# Patient Record
Sex: Male | Born: 1965 | Race: White | Hispanic: No | Smoking: Former smoker
Health system: Southern US, Community
[De-identification: ages and names within clinical notes are randomized; demographics above are authoritative.]

## PROBLEM LIST (undated history)

## (undated) DIAGNOSIS — G4730 Sleep apnea, unspecified: Secondary | ICD-10-CM

## (undated) DIAGNOSIS — K219 Gastro-esophageal reflux disease without esophagitis: Secondary | ICD-10-CM

## (undated) DIAGNOSIS — M1990 Unspecified osteoarthritis, unspecified site: Secondary | ICD-10-CM

## (undated) DIAGNOSIS — M109 Gout, unspecified: Secondary | ICD-10-CM

## (undated) DIAGNOSIS — Z87442 Personal history of urinary calculi: Secondary | ICD-10-CM

## (undated) HISTORY — PX: EYE SURGERY: SHX253

## (undated) HISTORY — PX: KIDNEY STONE SURGERY: SHX686

---

## 1999-11-03 ENCOUNTER — Encounter: Payer: Self-pay | Admitting: Urology

## 1999-11-03 ENCOUNTER — Encounter: Admission: RE | Admit: 1999-11-03 | Discharge: 1999-11-03 | Payer: Self-pay | Admitting: Urology

## 1999-11-11 ENCOUNTER — Encounter: Payer: Self-pay | Admitting: Urology

## 1999-11-11 ENCOUNTER — Ambulatory Visit (HOSPITAL_BASED_OUTPATIENT_CLINIC_OR_DEPARTMENT_OTHER): Admission: RE | Admit: 1999-11-11 | Discharge: 1999-11-11 | Payer: Self-pay | Admitting: Hematology and Oncology

## 2004-01-04 ENCOUNTER — Emergency Department (HOSPITAL_COMMUNITY): Admission: EM | Admit: 2004-01-04 | Discharge: 2004-01-04 | Payer: Self-pay | Admitting: Emergency Medicine

## 2004-10-26 ENCOUNTER — Emergency Department (HOSPITAL_COMMUNITY): Admission: EM | Admit: 2004-10-26 | Discharge: 2004-10-26 | Payer: Self-pay | Admitting: Emergency Medicine

## 2007-03-18 ENCOUNTER — Emergency Department (HOSPITAL_COMMUNITY): Admission: EM | Admit: 2007-03-18 | Discharge: 2007-03-18 | Payer: Self-pay | Admitting: Emergency Medicine

## 2011-10-19 ENCOUNTER — Ambulatory Visit
Admission: RE | Admit: 2011-10-19 | Discharge: 2011-10-19 | Disposition: A | Discharge status: Home / Self Care | Payer: BC Managed Care – PPO | Source: Ambulatory Visit | Attending: Family Medicine | Admitting: Family Medicine

## 2011-10-19 ENCOUNTER — Other Ambulatory Visit: Payer: Self-pay | Admitting: Family Medicine

## 2011-10-19 DIAGNOSIS — M25562 Pain in left knee: Secondary | ICD-10-CM

## 2012-09-08 ENCOUNTER — Ambulatory Visit: Payer: 59

## 2012-09-08 ENCOUNTER — Other Ambulatory Visit: Payer: Self-pay | Admitting: Physician Assistant

## 2012-09-08 ENCOUNTER — Ambulatory Visit (INDEPENDENT_AMBULATORY_CARE_PROVIDER_SITE_OTHER): Payer: 59 | Admitting: Emergency Medicine

## 2012-09-08 VITALS — BP 138/82 | HR 88 | Temp 98.1°F | Resp 16 | Ht 71.0 in | Wt 294.0 lb

## 2012-09-08 DIAGNOSIS — Z8639 Personal history of other endocrine, nutritional and metabolic disease: Secondary | ICD-10-CM

## 2012-09-08 DIAGNOSIS — M25539 Pain in unspecified wrist: Secondary | ICD-10-CM

## 2012-09-08 DIAGNOSIS — Z8739 Personal history of other diseases of the musculoskeletal system and connective tissue: Secondary | ICD-10-CM

## 2012-09-08 DIAGNOSIS — M109 Gout, unspecified: Secondary | ICD-10-CM

## 2012-09-08 LAB — POCT CBC
Granulocyte percent: 77.6 %G (ref 37–80)
MCH, POC: 28.8 pg (ref 27–31.2)
MCHC: 31.7 g/dL — AB (ref 31.8–35.4)
MCV: 91 fL (ref 80–97)
MID (cbc): 0.6 (ref 0–0.9)
POC LYMPH PERCENT: 16.8 %L (ref 10–50)
Platelet Count, POC: 273 10*3/uL (ref 142–424)
RDW, POC: 14.5 %

## 2012-09-08 LAB — BASIC METABOLIC PANEL
BUN: 19 mg/dL (ref 6–23)
Calcium: 9.6 mg/dL (ref 8.4–10.5)
Creat: 1.1 mg/dL (ref 0.50–1.35)
Glucose, Bld: 90 mg/dL (ref 70–99)
Sodium: 140 mEq/L (ref 135–145)

## 2012-09-08 MED ORDER — INDOMETHACIN 50 MG PO CAPS: 50.0000 mg | ORAL_CAPSULE | Freq: Three times a day (TID) | ORAL | Status: DC

## 2012-09-08 NOTE — Progress Notes (Signed)
Subjective:    Patient ID: Jordan Daniels, male    DOB: November 28, 1966, 46 y.o.   MRN: [id number]  HPI  Jordan Daniels is a 46 year old male with hx of gout who presents with 3 day history of right wrist pain.  He states that about 6 weeks ago his right wrist started hurting, and he felt it was a gout flare.  He took 2-3 days worth of meloxicam that he had left over from his last flare.  The pain resolved but returned 3 days ago.  He feels that this is also a gout flare, but he has never had them this close together before.  The pain is confined to his right wrist, which is where he always experiences gout pain.  He denies any mechanism of injury, but states that he does work with his hands and he also bowls.  He denies any numbness or tingling in the fingers.  His ROM is limited by pain.  He denies fever or chills.  He is not on uric acid lowering therapy but would be interested in trying this.    Review of Systems  Constitutional: Negative for fever and chills.  Respiratory: Negative.   Cardiovascular: Negative.   Gastrointestinal:       Some nausea today due to pain  Musculoskeletal: Negative.        Objective:   Physical Exam  Constitutional: He is oriented to person, place, and time. He appears well-developed and well-nourished. No distress.  Cardiovascular: Intact distal pulses.   Musculoskeletal:       Right elbow: Normal.      Right wrist: He exhibits decreased range of motion, tenderness and swelling.       Left wrist: Normal.  Neurological: He is alert and oriented to person, place, and time.  Skin: Skin is warm and dry.     Results for orders placed in visit on 09/08/12  POCT CBC      Component Value Range   WBC 11.4 (*) 4.6 - 10.2 K/uL   Lymph, poc 1.9  0.6 - 3.4   POC LYMPH PERCENT 16.8  10 - 50 %L   MID (cbc) 0.6  0 - 0.9   POC MID % 5.6  0 - 12 %M   POC Granulocyte 8.8 (*) 2 - 6.9   Granulocyte percent 77.6  37 - 80 %G   RBC 5.62  4.69 - 6.13 M/uL   Hemoglobin 16.2   14.1 - 18.1 g/dL   HCT, POC 13.0  86.5 - 53.7 %   MCV 91.0  80 - 97 fL   MCH, POC 28.8  27 - 31.2 pg   MCHC 31.7 (*) 31.8 - 35.4 g/dL   RDW, POC 78.4     Platelet Count, POC 273  142 - 424 K/uL   MPV 9.6  0 - 99.8 fL    [facility] reading (PRIMARY) by  Dr. Cleta Alberts, read as normal.      Assessment & Plan:   1. Gout  Basic metabolic panel, indomethacin (INDOCIN) 50 MG capsule  2. Wrist pain  DG Wrist 2 Views Right, POCT CBC  3. History of gout  Uric Acid   Jordan Daniels is a 46 yr old male with history of gout who presents with 3 day history of right wrist pain.  Wrist films were read as normal.  CBC shows only a slightly elevated WBC at 11.4, which is likely reactive.  Started him on indomethacin 50mg  TID.  He  may use Tylenol for breakthrough pain if needed.  BMP and uric acid levels are pending.  May consider urate lowering therapy depending on these results.  Discussed lifestyle modifications to help decrease the frequency of flares.  He will RTC if worsening or not improving.

## 2012-09-08 NOTE — Patient Instructions (Signed)
Gout Gout is an inflammatory condition (arthritis) caused by a buildup of uric acid crystals in the joints. Uric acid is a chemical that is normally present in the blood. Under some circumstances, uric acid can form into crystals in your joints. This causes joint redness, soreness, and swelling (inflammation). Repeat attacks are common. Over time, uric acid crystals can form into masses (tophi) near a joint, causing disfigurement. Gout is treatable and often preventable. CAUSES  The disease begins with elevated levels of uric acid in the blood. Uric acid is produced by your body when it breaks down a naturally found substance called purines. This also happens when you eat certain foods such as meats and fish. Causes of an elevated uric acid level include:  Being passed down from parent to child (heredity).  Diseases that cause increased uric acid production (obesity, psoriasis, some cancers).  Excessive alcohol use.  Diet, especially diets rich in meat and seafood.  Medicines, including certain cancer-fighting drugs (chemotherapy), diuretics, and aspirin.  Chronic kidney disease. The kidneys are no longer able to remove uric acid well.  Problems with metabolism. Conditions strongly associated with gout include:  Obesity.  High blood pressure.  High cholesterol.  Diabetes. Not everyone with elevated uric acid levels gets gout. It is not understood why some people get gout and others do not. Surgery, joint injury, and eating too much of certain foods are some of the factors that can lead to gout. SYMPTOMS   An attack of gout comes on quickly. It causes intense pain with redness, swelling, and warmth in a joint.  Fever can occur.  Often, only one joint is involved. Certain joints are more commonly involved:  Base of the big toe.  Knee.  Ankle.  Wrist.  Finger. Without treatment, an attack usually goes away in a few days to weeks. Between attacks, you usually will not have  symptoms, which is different from many other forms of arthritis. DIAGNOSIS  Your caregiver will suspect gout based on your symptoms and exam. Removal of fluid from the joint (arthrocentesis) is done to check for uric acid crystals. Your caregiver will give you a medicine that numbs the area (local anesthetic) and use a needle to remove joint fluid for exam. Gout is confirmed when uric acid crystals are seen in joint fluid, using a special microscope. Sometimes, blood, urine, and X-ray tests are also used. TREATMENT  There are 2 phases to gout treatment: treating the sudden onset (acute) attack and preventing attacks (prophylaxis). Treatment of an Acute Attack  Medicines are used. These include anti-inflammatory medicines or steroid medicines.  An injection of steroid medicine into the affected joint is sometimes necessary.  The painful joint is rested. Movement can worsen the arthritis.  You may use warm or cold treatments on painful joints, depending which works best for you.  Discuss the use of coffee, vitamin C, or cherries with your caregiver. These may be helpful treatment options. Treatment to Prevent Attacks After the acute attack subsides, your caregiver may advise prophylactic medicine. These medicines either help your kidneys eliminate uric acid from your body or decrease your uric acid production. You may need to stay on these medicines for a very long time. The early phase of treatment with prophylactic medicine can be associated with an increase in acute gout attacks. For this reason, during the first few months of treatment, your caregiver may also advise you to take medicines usually used for acute gout treatment. Be sure you understand your caregiver's directions.   You should also discuss dietary treatment with your caregiver. Certain foods such as meats and fish can increase uric acid levels. Other foods such as dairy can decrease levels. Your caregiver can give you a list of foods  to avoid. HOME CARE INSTRUCTIONS   Do not take aspirin to relieve pain. This raises uric acid levels.  Only take over-the-counter or prescription medicines for pain, discomfort, or fever as directed by your caregiver.  Rest the joint as much as possible. When in bed, keep sheets and blankets off painful areas.  Keep the affected joint raised (elevated).  Use crutches if the painful joint is in your leg.  Drink enough water and fluids to keep your urine clear or pale yellow. This helps your body get rid of uric acid. Do not drink alcoholic beverages. They slow the passage of uric acid.  Follow your caregiver's dietary instructions. Pay careful attention to the amount of protein you eat. Your daily diet should emphasize fruits, vegetables, whole grains, and fat-free or low-fat milk products.  Maintain a healthy body weight. SEEK MEDICAL CARE IF:   You have an oral temperature above 102 F (38.9 C).  You develop diarrhea, vomiting, or any side effects from medicines.  You do not feel better in 24 hours, or you are getting worse. SEEK IMMEDIATE MEDICAL CARE IF:   Your joint becomes suddenly more tender and you have:  Chills.  An oral temperature above 102 F (38.9 C), not controlled by medicine. MAKE SURE YOU:   Understand these instructions.  Will watch your condition.  Will get help right away if you are not doing well or get worse. Document Released: 11/13/2000 Document Revised: 02/08/2012 Document Reviewed: 02/24/2010 [employer] Patient Information 2013 [employer].    

## 2013-09-29 IMAGING — CR DG WRIST COMPLETE 3+V*R*
3 series · 3 of 3 positions shown · non-contrast
Comparison: None.

CLINICAL DATA: Right wrist pain.  History of gout.

RIGHT WRIST - COMPLETE 3+ VIEW

[PA]
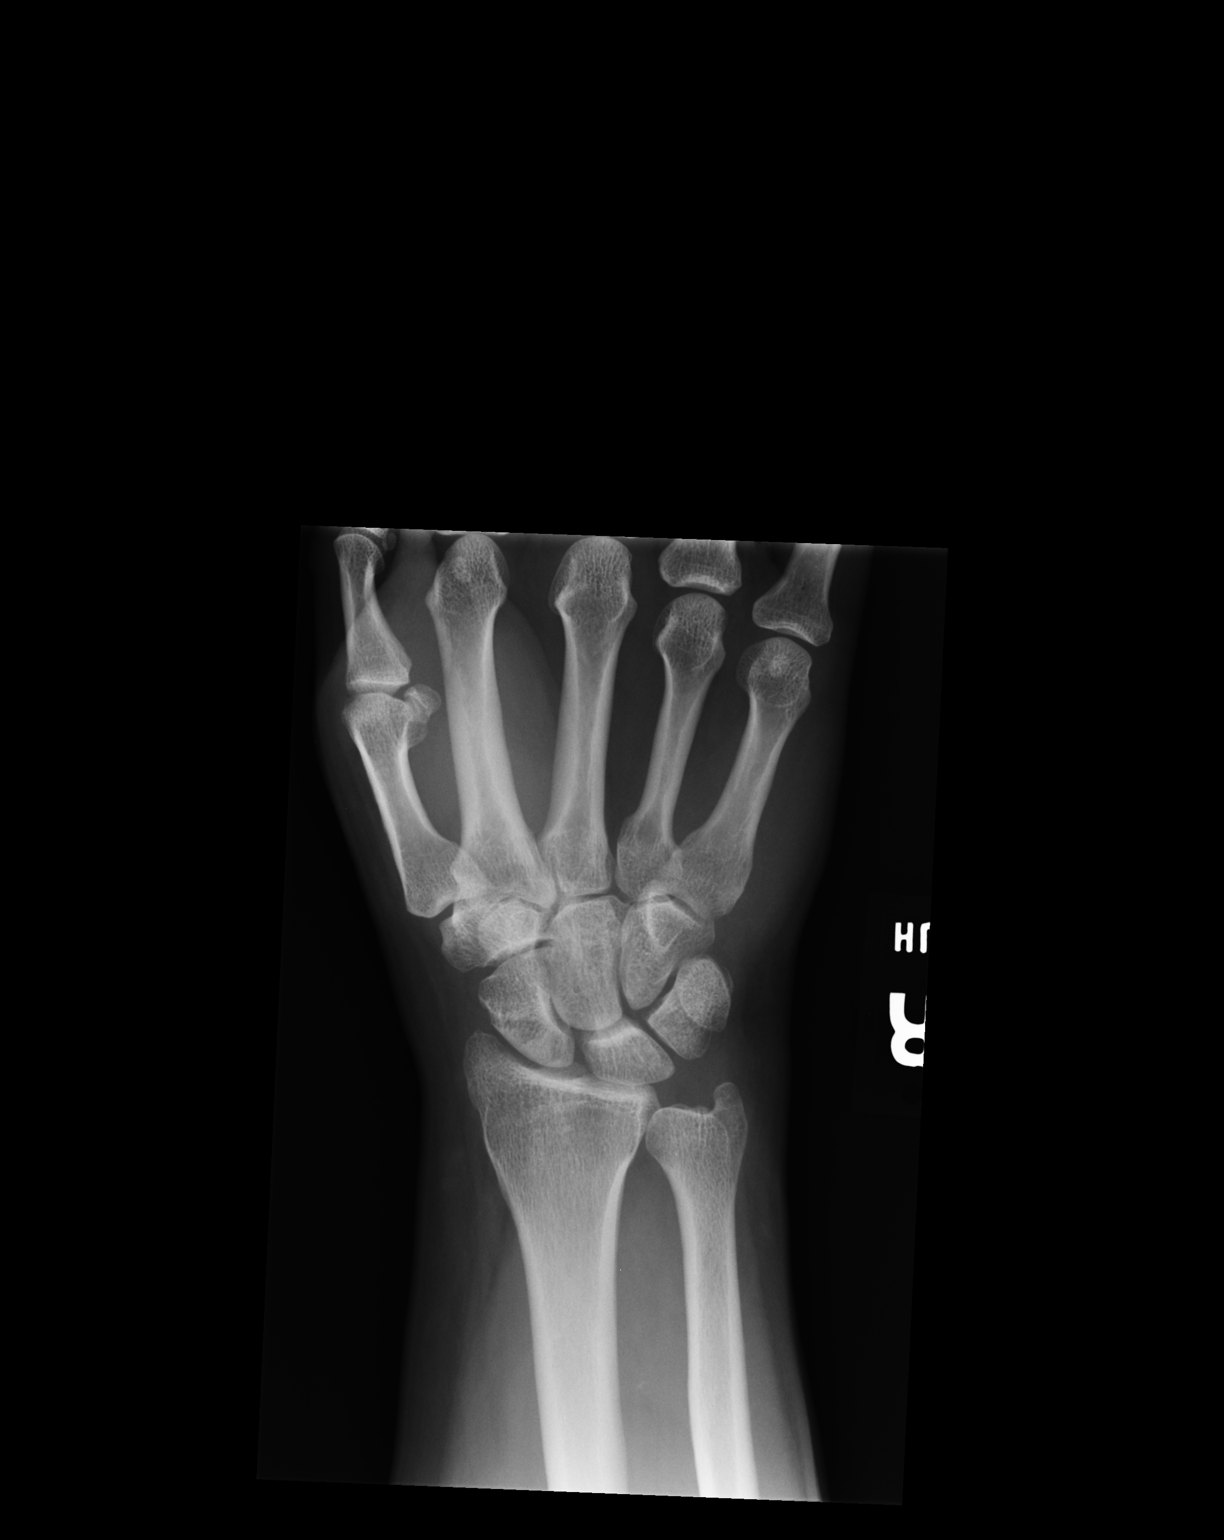

[lateral]
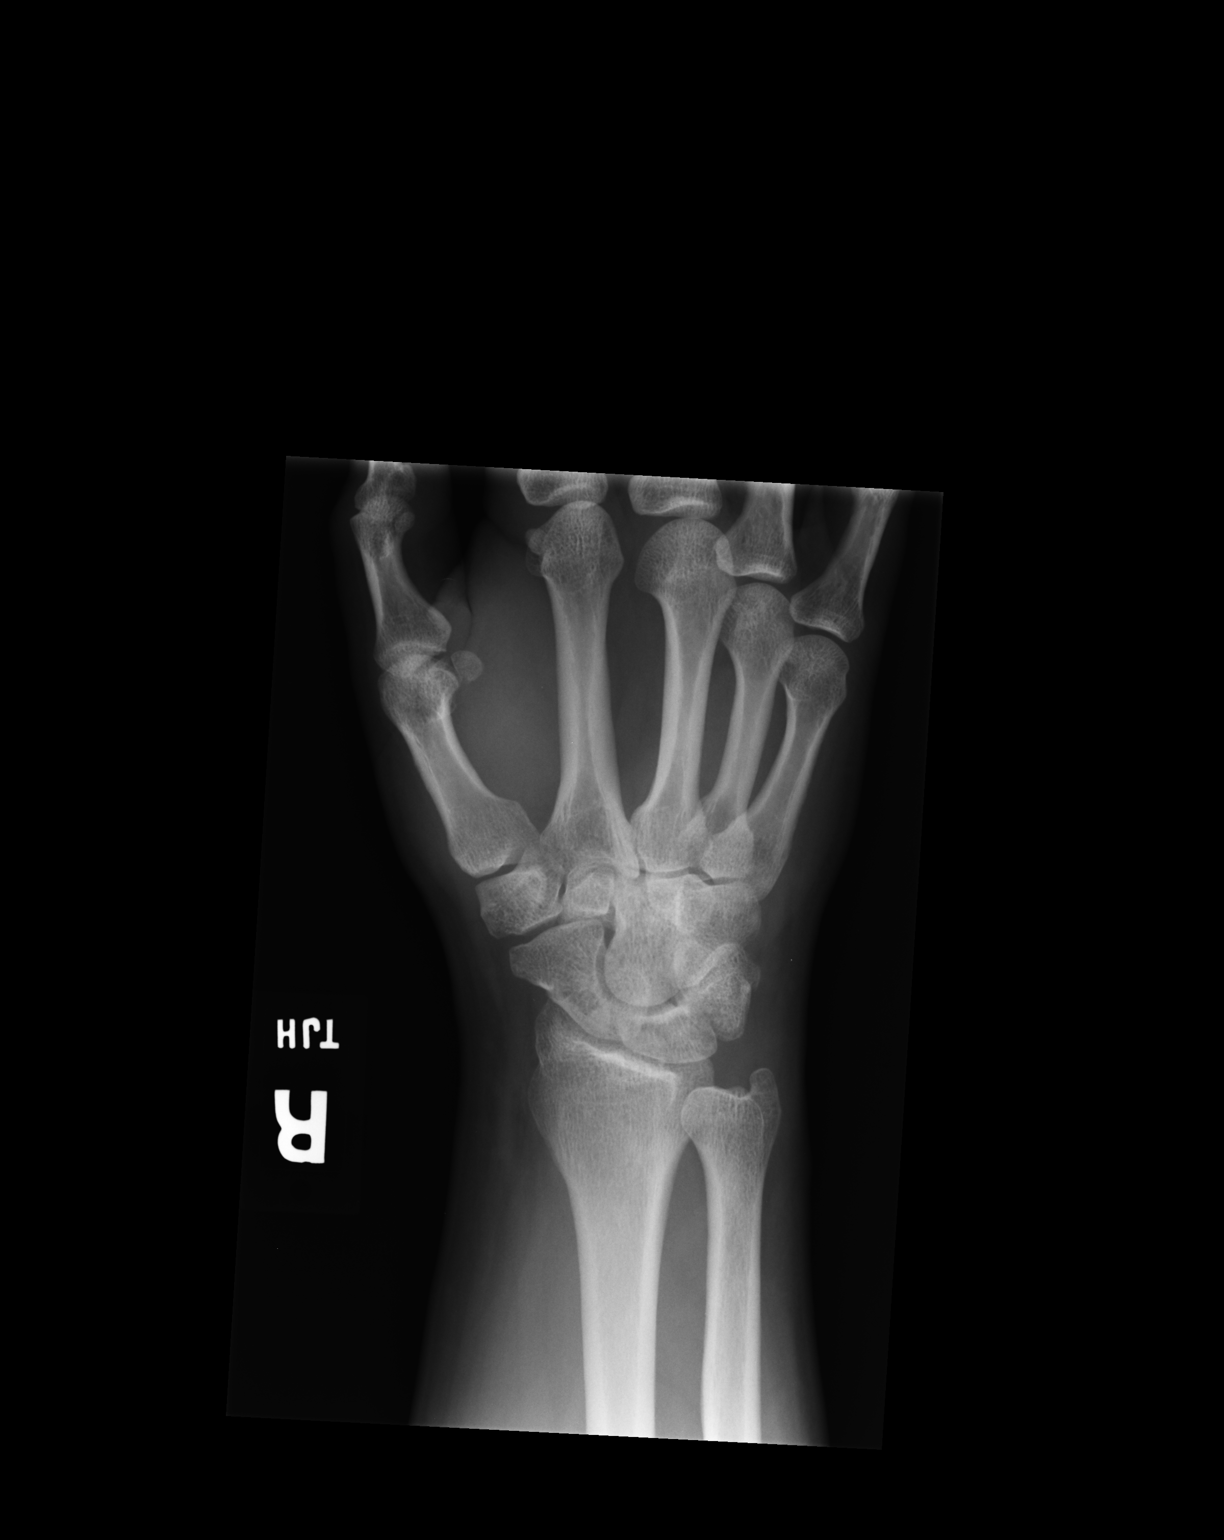

[ap ext rot]
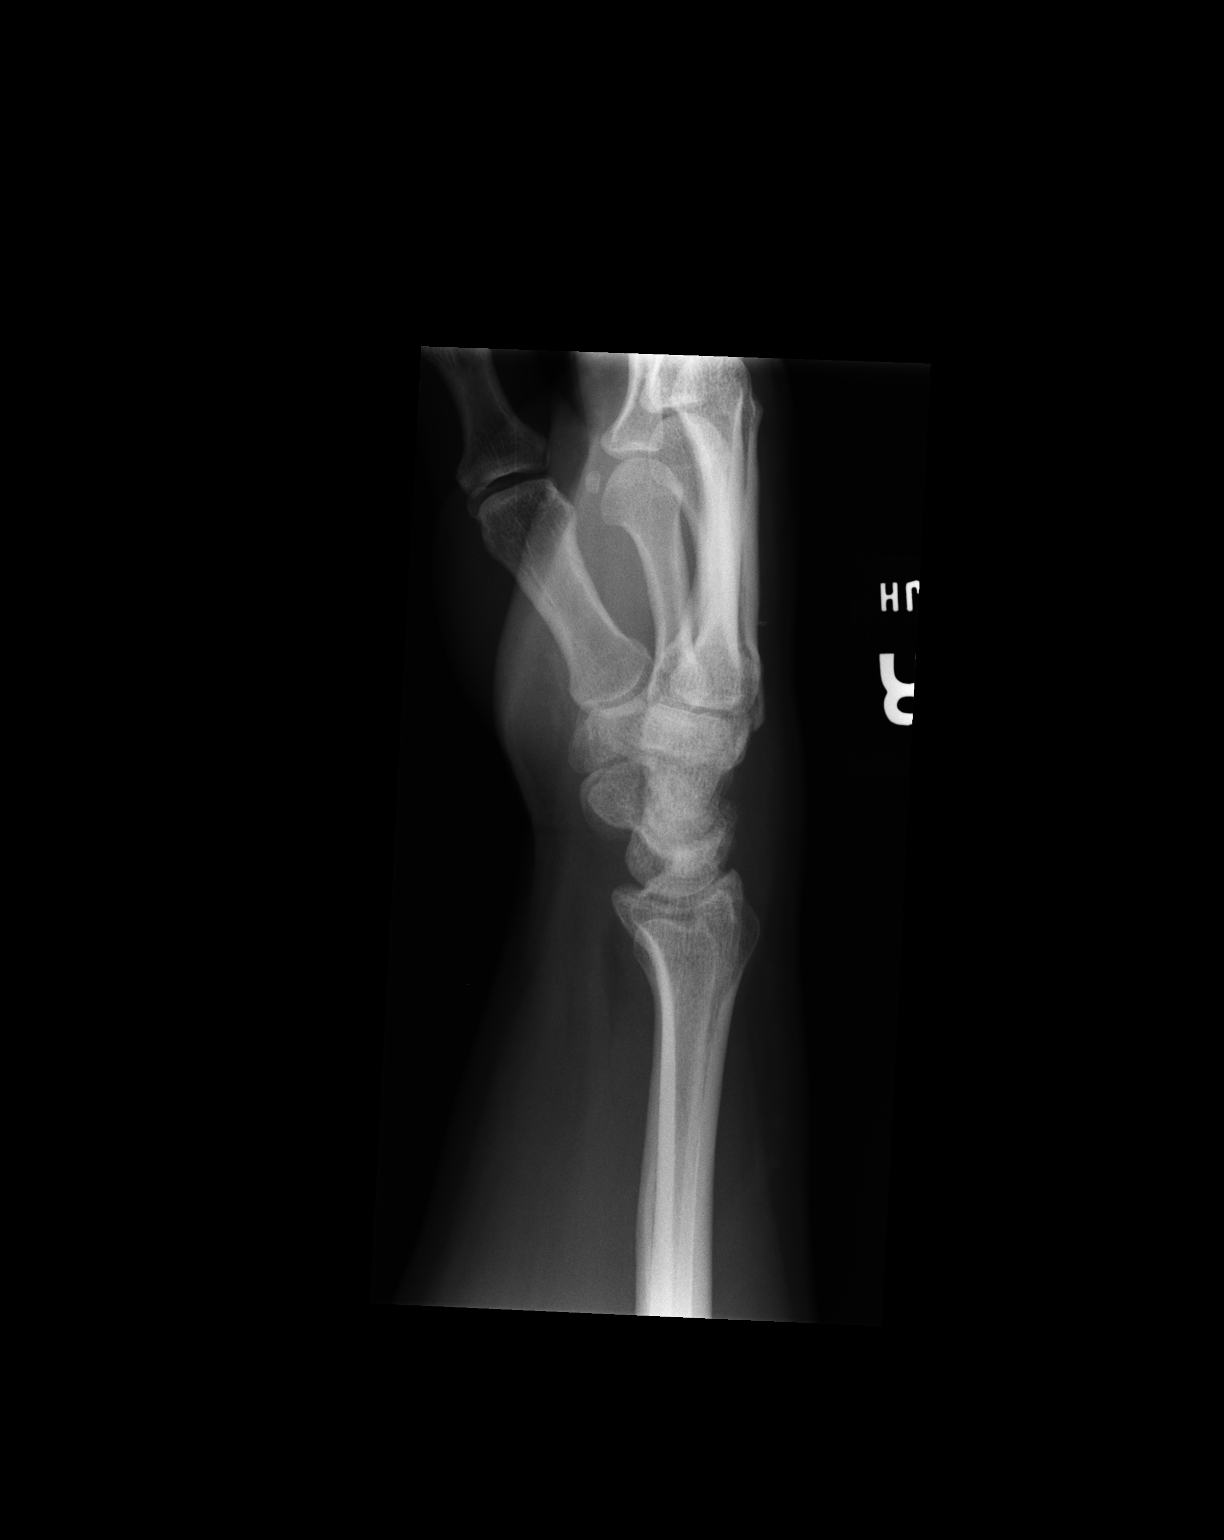

[3 of 3 positions shown; findings below may reference images not displayed]

FINDINGS: No acute bony abnormality.  Specifically, no fracture,
subluxation, or dislocation.  Soft tissues are intact. Joint spaces
are maintained.  Normal bone mineralization.
IMPRESSION: No acute bony abnormality.

## 2014-09-10 ENCOUNTER — Ambulatory Visit: Payer: 59 | Admitting: Family Medicine

## 2014-09-11 ENCOUNTER — Other Ambulatory Visit (INDEPENDENT_AMBULATORY_CARE_PROVIDER_SITE_OTHER): Payer: Self-pay

## 2014-09-11 DIAGNOSIS — K429 Umbilical hernia without obstruction or gangrene: Secondary | ICD-10-CM

## 2014-09-18 ENCOUNTER — Ambulatory Visit: Admission: RE | Admit: 2014-09-18 | Payer: 59 | Source: Ambulatory Visit

## 2014-09-18 ENCOUNTER — Other Ambulatory Visit (INDEPENDENT_AMBULATORY_CARE_PROVIDER_SITE_OTHER): Payer: Self-pay | Admitting: General Surgery

## 2014-09-18 ENCOUNTER — Ambulatory Visit
Admission: RE | Admit: 2014-09-18 | Discharge: 2014-09-18 | Disposition: A | Discharge status: Home / Self Care | Payer: 59 | Source: Ambulatory Visit

## 2014-09-18 ENCOUNTER — Ambulatory Visit
Admission: RE | Admit: 2014-09-18 | Discharge: 2014-09-18 | Disposition: A | Discharge status: Home / Self Care | Payer: 59 | Source: Ambulatory Visit | Attending: General Surgery | Admitting: General Surgery

## 2014-09-18 DIAGNOSIS — K429 Umbilical hernia without obstruction or gangrene: Secondary | ICD-10-CM

## 2014-09-19 ENCOUNTER — Ambulatory Visit (INDEPENDENT_AMBULATORY_CARE_PROVIDER_SITE_OTHER): Payer: Self-pay | Admitting: General Surgery

## 2014-10-23 NOTE — Progress Notes (Signed)
CT abdomen pelvis 09/18/14 on [facility]

## 2014-10-23 NOTE — Patient Instructions (Addendum)
20 Jordan BearsJoseph C Daniels  10/23/2014   Your procedure is scheduled on: Friday 11/02/14   [facility] at 08:00 AM.  Call this number if you have problems the morning of surgery 336-: [phone]   Remember:   Do not eat food or drink liquids After Midnight.     Take these medicines the morning of surgery with A SIP OF WATER: prednisone   Do not wear jewelry, make-up or nail polish.  Do not wear lotions, powders, or perfumes.   Do not shave 48 hours prior to surgery. Men may shave face and neck.  Do not bring valuables to the hospital.  Contacts, dentures or bridgework may not be worn into surgery.   Patients discharged the day of surgery will not be allowed to drive home.  Name and phone number of your driver: [name] [name]   [facility] - Preparing for Surgery Before surgery, you can play an important role.  Because skin is not sterile, your skin needs to be as free of germs as possible.  You can reduce the number of germs on your skin by washing with CHG (chlorahexidine gluconate) soap before surgery.  CHG is an antiseptic cleaner which kills germs and bonds with the skin to continue killing germs even after washing. Please DO NOT use if you have an allergy to CHG or antibacterial soaps.  If your skin becomes reddened/irritated stop using the CHG and inform your nurse when you arrive at Short Stay. Do not shave (including legs and underarms) for at least 48 hours prior to the first CHG shower.  You may shave your face/neck. Please follow these instructions carefully:  1.  Shower with CHG Soap the night before surgery and the  morning of Surgery.  2.  If you choose to wash your hair, wash your hair first as usual with your  normal  shampoo.  3.  After you shampoo, rinse your hair and body thoroughly to remove the  shampoo.                            4.  Use CHG as you would any other liquid soap.  You can apply chg directly  to the skin  and wash                       Gently with a scrungie or clean washcloth.  5.  Apply the CHG Soap to your body ONLY FROM THE NECK DOWN.   Do not use on face/ open                           Wound or open sores. Avoid contact with eyes, ears mouth and genitals (private parts).                       Wash face,  Genitals (private parts) with your normal soap.             6.  Wash thoroughly, paying special attention to the area where your surgery  will be performed.  7.  Thoroughly rinse your body with warm water from the neck down.  8.  DO NOT shower/wash with your normal soap after using and rinsing off  the CHG Soap.                9.  Dennie BiblePat  yourself dry with a clean towel.            10.  Wear clean pajamas.            11.  Place clean sheets on your bed the night of your first shower and do not  sleep with pets. Day of Surgery : Do not apply any lotions/deodorants the morning of surgery.  Please wear clean clothes to the hospital/surgery center.  FAILURE TO FOLLOW THESE INSTRUCTIONS MAY RESULT IN THE CANCELLATION OF YOUR SURGERY PATIENT SIGNATURE_________________________________  NURSE SIGNATURE__________________________________  ________________________________________________________________________

## 2014-10-24 ENCOUNTER — Encounter (HOSPITAL_COMMUNITY)
Admission: RE | Admit: 2014-10-24 | Discharge: 2014-10-24 | Disposition: A | Discharge status: Home / Self Care | Payer: 59 | Source: Ambulatory Visit | Attending: General Surgery | Admitting: General Surgery

## 2014-10-24 ENCOUNTER — Encounter (HOSPITAL_COMMUNITY): Payer: Self-pay

## 2014-10-24 DIAGNOSIS — Z01812 Encounter for preprocedural laboratory examination: Secondary | ICD-10-CM | POA: Insufficient documentation

## 2014-10-24 HISTORY — DX: Sleep apnea, unspecified: G47.30

## 2014-10-24 HISTORY — DX: Gastro-esophageal reflux disease without esophagitis: K21.9

## 2014-10-24 HISTORY — DX: Gout, unspecified: M10.9

## 2014-10-24 HISTORY — DX: Unspecified osteoarthritis, unspecified site: M19.90

## 2014-10-24 HISTORY — DX: Personal history of urinary calculi: Z87.442

## 2014-10-24 LAB — BASIC METABOLIC PANEL
Anion gap: 14 (ref 5–15)
BUN: 23 mg/dL (ref 6–23)
CHLORIDE: 101 meq/L (ref 96–112)
CO2: 26 mEq/L (ref 19–32)
Calcium: 9.8 mg/dL (ref 8.4–10.5)
Creatinine, Ser: 0.89 mg/dL (ref 0.50–1.35)
GFR calc Af Amer: >90 mL/min (ref >90)
GFR calc non Af Amer: >90 mL/min (ref >90)
Glucose, Bld: 121 mg/dL — ABNORMAL HIGH (ref 70–99)
POTASSIUM: 5 meq/L (ref 3.7–5.3)
Sodium: 141 mEq/L (ref 137–147)

## 2014-10-24 LAB — CBC
HEMATOCRIT: 45.8 % (ref 39.0–52.0)
Hemoglobin: 14.9 g/dL (ref 13.0–17.0)
MCH: 28.2 pg (ref 26.0–34.0)
MCHC: 32.5 g/dL (ref 30.0–36.0)
MCV: 86.7 fL (ref 78.0–100.0)
Platelets: 375 10*3/uL (ref 150–400)
RBC: 5.28 MIL/uL (ref 4.22–5.81)
RDW: 14.1 % (ref 11.5–15.5)
WBC: 11.8 10*3/uL — AB (ref 4.0–10.5)

## 2014-10-24 NOTE — Progress Notes (Signed)
   10/24/14 0934  OBSTRUCTIVE SLEEP APNEA  Have you ever been diagnosed with sleep apnea through a sleep study? No  Do you snore loudly (loud enough to be heard through closed doors)?  1  Do you often feel tired, fatigued, or sleepy during the daytime? 1  Has anyone observed you stop breathing during your sleep? 1  Do you have, or are you being treated for high blood pressure? 0  BMI more than 35 kg/m2? 1  Age over 49 years old? 0  Neck circumference greater than 40 cm/16 inches? 1  Gender: 1  Obstructive Sleep Apnea Score 6  Score 4 or greater  Results sent to PCP

## 2014-11-01 MED ORDER — DEXTROSE 5 % IV SOLN
3.0000 g | INTRAVENOUS | Status: AC
  Administered 2014-11-02: 3 g via INTRAVENOUS
  Filled 2014-11-01: qty 3000

## 2014-11-02 ENCOUNTER — Ambulatory Visit (HOSPITAL_COMMUNITY): Payer: 59 | Admitting: Anesthesiology

## 2014-11-02 ENCOUNTER — Ambulatory Visit (HOSPITAL_COMMUNITY)
Admission: RE | Admit: 2014-11-02 | Discharge: 2014-11-02 | Disposition: A | Discharge status: Home / Self Care | Payer: 59 | Source: Ambulatory Visit | Attending: General Surgery | Admitting: General Surgery

## 2014-11-02 ENCOUNTER — Encounter (HOSPITAL_COMMUNITY): Payer: Self-pay | Admitting: *Deleted

## 2014-11-02 ENCOUNTER — Encounter (HOSPITAL_COMMUNITY)
Admission: RE | Disposition: A | Discharge status: Home / Self Care | Payer: Self-pay | Source: Ambulatory Visit | Attending: General Surgery

## 2014-11-02 DIAGNOSIS — Z87891 Personal history of nicotine dependence: Secondary | ICD-10-CM | POA: Insufficient documentation

## 2014-11-02 DIAGNOSIS — K219 Gastro-esophageal reflux disease without esophagitis: Secondary | ICD-10-CM | POA: Insufficient documentation

## 2014-11-02 DIAGNOSIS — M109 Gout, unspecified: Secondary | ICD-10-CM | POA: Insufficient documentation

## 2014-11-02 DIAGNOSIS — G4730 Sleep apnea, unspecified: Secondary | ICD-10-CM | POA: Insufficient documentation

## 2014-11-02 DIAGNOSIS — K420 Umbilical hernia with obstruction, without gangrene: Secondary | ICD-10-CM | POA: Insufficient documentation

## 2014-11-02 DIAGNOSIS — M1990 Unspecified osteoarthritis, unspecified site: Secondary | ICD-10-CM | POA: Diagnosis not present

## 2014-11-02 DIAGNOSIS — Z6841 Body Mass Index (BMI) 40.0 and over, adult: Secondary | ICD-10-CM | POA: Diagnosis not present

## 2014-11-02 HISTORY — PX: VENTRAL HERNIA REPAIR: SHX424

## 2014-11-02 HISTORY — PX: INSERTION OF MESH: SHX5868

## 2014-11-02 SURGERY — REPAIR, HERNIA, VENTRAL, LAPAROSCOPIC
Anesthesia: General | Site: Abdomen

## 2014-11-02 MED ORDER — MORPHINE SULFATE 10 MG/ML IJ SOLN: 2.0000 mg | INTRAMUSCULAR | Status: DC | PRN

## 2014-11-02 MED ORDER — SODIUM CHLORIDE 0.9 % IJ SOLN: 3.0000 mL | Freq: Two times a day (BID) | INTRAMUSCULAR | Status: DC

## 2014-11-02 MED ORDER — FENTANYL CITRATE 0.05 MG/ML IJ SOLN
INTRAMUSCULAR | Status: AC
  Filled 2014-11-02: qty 2

## 2014-11-02 MED ORDER — ONDANSETRON HCL 4 MG/2ML IJ SOLN
INTRAMUSCULAR | Status: DC | PRN
  Administered 2014-11-02: 4 mg via INTRAVENOUS

## 2014-11-02 MED ORDER — OXYCODONE-ACETAMINOPHEN 7.5-325 MG PO TABS: 1.0000 | ORAL_TABLET | ORAL | Status: DC | PRN

## 2014-11-02 MED ORDER — ONDANSETRON HCL 4 MG/2ML IJ SOLN
INTRAMUSCULAR | Status: AC
  Filled 2014-11-02: qty 2

## 2014-11-02 MED ORDER — MIDAZOLAM HCL 2 MG/2ML IJ SOLN
INTRAMUSCULAR | Status: AC
  Filled 2014-11-02: qty 2

## 2014-11-02 MED ORDER — MIDAZOLAM HCL 5 MG/5ML IJ SOLN
INTRAMUSCULAR | Status: DC | PRN
  Administered 2014-11-02: 2 mg via INTRAVENOUS

## 2014-11-02 MED ORDER — PROMETHAZINE HCL 25 MG/ML IJ SOLN
6.2500 mg | INTRAMUSCULAR | Status: DC | PRN
  Administered 2014-11-02: 6.25 mg via INTRAVENOUS
  Filled 2014-11-02: qty 1

## 2014-11-02 MED ORDER — FENTANYL CITRATE 0.05 MG/ML IJ SOLN
INTRAMUSCULAR | Status: AC
  Filled 2014-11-02: qty 5

## 2014-11-02 MED ORDER — SODIUM CHLORIDE 0.9 % IV SOLN: 250.0000 mL | INTRAVENOUS | Status: DC | PRN

## 2014-11-02 MED ORDER — GLYCOPYRROLATE 0.2 MG/ML IJ SOLN
INTRAMUSCULAR | Status: DC | PRN
  Administered 2014-11-02: .8 mg via INTRAVENOUS

## 2014-11-02 MED ORDER — KETOROLAC TROMETHAMINE 30 MG/ML IJ SOLN
15.0000 mg | Freq: Once | INTRAMUSCULAR | Status: AC | PRN
  Administered 2014-11-02: 30 mg via INTRAVENOUS
  Filled 2014-11-02: qty 1

## 2014-11-02 MED ORDER — PROPOFOL 10 MG/ML IV BOLUS
INTRAVENOUS | Status: DC | PRN
  Administered 2014-11-02: 250 mg via INTRAVENOUS

## 2014-11-02 MED ORDER — EPHEDRINE SULFATE 50 MG/ML IJ SOLN
INTRAMUSCULAR | Status: AC
  Filled 2014-11-02: qty 1

## 2014-11-02 MED ORDER — LACTATED RINGERS IV SOLN
INTRAVENOUS | Status: DC | PRN
  Administered 2014-11-02 (×2): via INTRAVENOUS

## 2014-11-02 MED ORDER — OXYCODONE HCL 5 MG PO TABS: 5.0000 mg | ORAL_TABLET | ORAL | Status: DC | PRN

## 2014-11-02 MED ORDER — LIDOCAINE HCL (CARDIAC) 20 MG/ML IV SOLN
INTRAVENOUS | Status: DC | PRN
  Administered 2014-11-02: 50 mg via INTRAVENOUS
  Administered 2014-11-02: 100 mg via INTRAVENOUS

## 2014-11-02 MED ORDER — PROPOFOL 10 MG/ML IV BOLUS
INTRAVENOUS | Status: AC
  Filled 2014-11-02: qty 20

## 2014-11-02 MED ORDER — DEXAMETHASONE SODIUM PHOSPHATE 10 MG/ML IJ SOLN
INTRAMUSCULAR | Status: DC | PRN
  Administered 2014-11-02: 10 mg via INTRAVENOUS

## 2014-11-02 MED ORDER — NEOSTIGMINE METHYLSULFATE 10 MG/10ML IV SOLN
INTRAVENOUS | Status: DC | PRN
  Administered 2014-11-02: 5 mg via INTRAVENOUS

## 2014-11-02 MED ORDER — ACETAMINOPHEN 650 MG RE SUPP
650.0000 mg | RECTAL | Status: DC | PRN
  Filled 2014-11-02: qty 1

## 2014-11-02 MED ORDER — HYDROMORPHONE HCL 1 MG/ML IJ SOLN
INTRAMUSCULAR | Status: DC | PRN
Start: 2014-11-02 — End: 2014-11-02
  Administered 2014-11-02: .5 mg via INTRAVENOUS
  Administered 2014-11-02: 0.5 mg via INTRAVENOUS

## 2014-11-02 MED ORDER — BUPIVACAINE-EPINEPHRINE 0.25% -1:200000 IJ SOLN
INTRAMUSCULAR | Status: AC
  Filled 2014-11-02: qty 1

## 2014-11-02 MED ORDER — HYDROMORPHONE HCL 1 MG/ML IJ SOLN
INTRAMUSCULAR | Status: AC
  Filled 2014-11-02: qty 1

## 2014-11-02 MED ORDER — SUCCINYLCHOLINE CHLORIDE 20 MG/ML IJ SOLN
INTRAMUSCULAR | Status: DC | PRN
  Administered 2014-11-02: 180 mg via INTRAVENOUS

## 2014-11-02 MED ORDER — LACTATED RINGERS IV SOLN: INTRAVENOUS | Status: DC

## 2014-11-02 MED ORDER — PHENYLEPHRINE HCL 10 MG/ML IJ SOLN
INTRAMUSCULAR | Status: DC | PRN
  Administered 2014-11-02: 40 ug via INTRAVENOUS

## 2014-11-02 MED ORDER — ROCURONIUM BROMIDE 100 MG/10ML IV SOLN
INTRAVENOUS | Status: AC
  Filled 2014-11-02: qty 1

## 2014-11-02 MED ORDER — LIDOCAINE HCL (CARDIAC) 20 MG/ML IV SOLN
INTRAVENOUS | Status: AC
  Filled 2014-11-02: qty 5

## 2014-11-02 MED ORDER — BUPIVACAINE-EPINEPHRINE 0.25% -1:200000 IJ SOLN
INTRAMUSCULAR | Status: DC | PRN
  Administered 2014-11-02: 20 mL

## 2014-11-02 MED ORDER — SODIUM CHLORIDE 0.9 % IJ SOLN
INTRAMUSCULAR | Status: AC
  Filled 2014-11-02: qty 10

## 2014-11-02 MED ORDER — FENTANYL CITRATE 0.05 MG/ML IJ SOLN
INTRAMUSCULAR | Status: DC | PRN
  Administered 2014-11-02: 100 ug via INTRAVENOUS
  Administered 2014-11-02: 50 ug via INTRAVENOUS
  Administered 2014-11-02: 100 ug via INTRAVENOUS
  Administered 2014-11-02 (×2): 50 ug via INTRAVENOUS

## 2014-11-02 MED ORDER — SODIUM CHLORIDE 0.9 % IJ SOLN: 3.0000 mL | INTRAMUSCULAR | Status: DC | PRN

## 2014-11-02 MED ORDER — ACETAMINOPHEN 325 MG PO TABS: 650.0000 mg | ORAL_TABLET | ORAL | Status: DC | PRN

## 2014-11-02 MED ORDER — 0.9 % SODIUM CHLORIDE (POUR BTL) OPTIME
TOPICAL | Status: DC | PRN
  Administered 2014-11-02: 1000 mL

## 2014-11-02 MED ORDER — ACETAMINOPHEN 10 MG/ML IV SOLN
1000.0000 mg | Freq: Once | INTRAVENOUS | Status: AC
  Administered 2014-11-02: 1000 mg via INTRAVENOUS
  Filled 2014-11-02: qty 100

## 2014-11-02 MED ORDER — GLYCOPYRROLATE 0.2 MG/ML IJ SOLN
INTRAMUSCULAR | Status: AC
  Filled 2014-11-02: qty 3

## 2014-11-02 MED ORDER — ROCURONIUM BROMIDE 100 MG/10ML IV SOLN
INTRAVENOUS | Status: DC | PRN
  Administered 2014-11-02: 50 mg via INTRAVENOUS
  Administered 2014-11-02: 20 mg via INTRAVENOUS
  Administered 2014-11-02 (×2): 10 mg via INTRAVENOUS

## 2014-11-02 MED ORDER — NEOSTIGMINE METHYLSULFATE 10 MG/10ML IV SOLN
INTRAVENOUS | Status: AC
  Filled 2014-11-02: qty 1

## 2014-11-02 MED ORDER — HYDROMORPHONE HCL 1 MG/ML IJ SOLN
0.2500 mg | INTRAMUSCULAR | Status: DC | PRN
  Administered 2014-11-02 (×3): 0.5 mg via INTRAVENOUS

## 2014-11-02 SURGICAL SUPPLY — 49 items
ADH SKN CLS APL DERMABOND .7 (GAUZE/BANDAGES/DRESSINGS) ×1
APL SKNCLS STERI-STRIP NONHPOA (GAUZE/BANDAGES/DRESSINGS) ×1
APPLIER CLIP 5 13 M/L LIGAMAX5 (MISCELLANEOUS)
APR CLP MED LRG 5 ANG JAW (MISCELLANEOUS)
BENZOIN TINCTURE PRP APPL 2/3 (GAUZE/BANDAGES/DRESSINGS) ×3 IMPLANT
BINDER ABDOMINAL 12 ML 46-62 (SOFTGOODS) IMPLANT
BINDER BREAST LRG (GAUZE/BANDAGES/DRESSINGS) ×2 IMPLANT
CABLE HIGH FREQUENCY MONO STRZ (ELECTRODE) ×3 IMPLANT
CHLORAPREP W/TINT 26ML (MISCELLANEOUS) ×3 IMPLANT
CLIP APPLIE 5 13 M/L LIGAMAX5 (MISCELLANEOUS) IMPLANT
CLOSURE WOUND 1/2 X4 (GAUZE/BANDAGES/DRESSINGS) ×1
DECANTER SPIKE VIAL GLASS SM (MISCELLANEOUS) ×3 IMPLANT
DERMABOND ADVANCED (GAUZE/BANDAGES/DRESSINGS) ×2
DERMABOND ADVANCED .7 DNX12 (GAUZE/BANDAGES/DRESSINGS) IMPLANT
DEVICE PMI PUNCTURE CLOSURE (MISCELLANEOUS) IMPLANT
DEVICE RELIATACK FIXATION (MISCELLANEOUS) ×2 IMPLANT
DEVICE SECURE STRAP 25 ABSORB (INSTRUMENTS) ×4 IMPLANT
DEVICE TROCAR PUNCTURE CLOSURE (ENDOMECHANICALS) ×2 IMPLANT
DRAPE LAPAROTOMY TRNSV 102X78 (DRAPE) ×3 IMPLANT
ELECT REM PT RETURN 9FT ADLT (ELECTROSURGICAL) ×3
ELECTRODE REM PT RTRN 9FT ADLT (ELECTROSURGICAL) ×1 IMPLANT
GLOVE BIO SURGEON STRL SZ7.5 (GLOVE) ×6 IMPLANT
GOWN STRL REUS W/TWL XL LVL3 (GOWN DISPOSABLE) ×9 IMPLANT
KIT BASIN OR (CUSTOM PROCEDURE TRAY) ×3 IMPLANT
MARKER SKIN DUAL TIP RULER LAB (MISCELLANEOUS) ×3 IMPLANT
MESH VENTRALIGHT ST 8IN CRC (Mesh General) ×2 IMPLANT
NDL INSUFFLATION 14GA 120MM (NEEDLE) ×1 IMPLANT
NDL SPNL 22GX3.5 QUINCKE BK (NEEDLE) ×1 IMPLANT
NEEDLE INSUFFLATION 14GA 120MM (NEEDLE) ×3 IMPLANT
NEEDLE SPNL 22GX3.5 QUINCKE BK (NEEDLE) ×3 IMPLANT
SCISSORS LAP 5X35 DISP (ENDOMECHANICALS) ×3 IMPLANT
SET IRRIG TUBING LAPAROSCOPIC (IRRIGATION / IRRIGATOR) IMPLANT
SHEARS HARMONIC ACE PLUS 36CM (ENDOMECHANICALS) IMPLANT
SLEEVE XCEL OPT CAN 5 100 (ENDOMECHANICALS) ×6 IMPLANT
SOLUTION ANTI FOG 6CC (MISCELLANEOUS) IMPLANT
STRIP CLOSURE SKIN 1/2X4 (GAUZE/BANDAGES/DRESSINGS) ×2 IMPLANT
SUT CHROMIC 2 0 SH (SUTURE) ×3 IMPLANT
SUT ETHIBOND CT1 BRD #0 30IN (SUTURE) ×4 IMPLANT
SUT ETHIBOND NAB CT1 #1 30IN (SUTURE) ×4 IMPLANT
SUT MNCRL AB 4-0 PS2 18 (SUTURE) ×3 IMPLANT
SUT PROLENE 2 0 KS (SUTURE) ×8 IMPLANT
SUT VIC AB 2-0 SH 18 (SUTURE) ×3 IMPLANT
TOWEL OR 17X26 10 PK STRL BLUE (TOWEL DISPOSABLE) ×3 IMPLANT
TOWEL OR NON WOVEN STRL DISP B (DISPOSABLE) ×3 IMPLANT
TRAY FOLEY CATH 14FRSI W/METER (CATHETERS) ×3 IMPLANT
TRAY LAPAROSCOPIC (CUSTOM PROCEDURE TRAY) ×3 IMPLANT
TROCAR XCEL BLUNT TIP 100MML (ENDOMECHANICALS) IMPLANT
TROCAR XCEL NON-BLD 11X100MML (ENDOMECHANICALS) ×3 IMPLANT
TUBING INSUFFLATION 10FT LAP (TUBING) ×3 IMPLANT

## 2014-11-02 NOTE — Progress Notes (Signed)
Left hand fingers assessment same as at 1302

## 2014-11-02 NOTE — Transfer of Care (Signed)
Immediate Anesthesia Transfer of Care Note  Patient: Jordan Daniels  Procedure(s) Performed: Procedure(s) (LRB): LAPAROSCOPIC VENTRAL HERNIA REPAIR WITH MESH (N/A) INSERTION OF MESH (N/A)  Patient Location: PACU  Anesthesia Type: General  Level of Consciousness: sedated, patient cooperative and responds to stimulation  Airway & Oxygen Therapy: Patient Spontanous Breathing and Patient connected to face mask oxgen  Post-op Assessment: Report given to PACU RN and Post -op Vital signs reviewed and stable  Post vital signs: Reviewed and stable  Complications: No apparent anesthesia complications

## 2014-11-02 NOTE — Op Note (Signed)
11/02/2014  11:43 AM  PATIENT:  Jordan BearsJoseph C Andress  48 y.o. male  PRE-OPERATIVE DIAGNOSIS:  Incarcerated umbilical hernia  POST-OPERATIVE DIAGNOSIS: incarcerated umbilical hernia  PROCEDURE:  Procedure(s): LAPAROSCOPIC VENTRAL HERNIA REPAIR WITH MESH (N/A) INSERTION OF MESH, RESECTION OF REDUNDANT UMBILICAL SKIN (N/A)  SURGEON:  Surgeon(s) and Role:    * Axel FillerArmando Norton Bivins, MD - Primary ASSISTANTS: none   ANESTHESIA:   local and general  EBL:  Total I/O In: 1000 [I.V.:1000] Out: -   BLOOD ADMINISTERED:none  DRAINS: none   LOCAL MEDICATIONS USED:  BUPIVICAINE   SPECIMEN:  No Specimen  DISPOSITION OF SPECIMEN:  N/A  COUNTS:  YES  TOURNIQUET:  * No tourniquets in log *  DICTATION: .Dragon Dictation Details of the procedure:   After the patient was consented patient was taken back to the operating room patient was then placed in supine position bilateral SCDs in place.  The patient was prepped and draped in the usual sterile fashion. After antibiotics were confirmed a timeout was called and all facts were verified. The Veress needle technique was used to insuflate the abdomen at Palmer's point. The abdomen was insufflated to 14 mm mercury. Subsequently a 5 mm trocar was placed a camera inserted there was no injury to any intra-abdominal organs.  There was seen to be an incarcerated umbilical hernia.  A second camera port was in placed into the left lower quadrant.   At this the Falicform ligament was taken down  with Bovie cautery maintaining hemostasis. A 5mm port was placed in the epigastrium . I proceeded to reduce the hernia contents which took a lot of counterpressure from the outside.    At this time proceeded to resect the redundant umbilical skin. The skin measured approximately 5 x 4 x 6 cm. Once this was done a piece of Bard Ventralight 20cm mesh was then inserted into the abdomen. The fascia was then reapproximated using figure-of-eight #1 Ethibonds 5. This reapproximated  the fascia. At this time proceeded again with laparoscopic procedure. The mesh was brought up to the anterior abdominal wall. The mesh was secured circumferentially with am Securestrap tacker in a double crown fashion. At this time a 2-0 Prolene was used. Needle to place a transfacial sutures at the 12:00, 3:00, 6:00, 9:00 positions. The mesh lay flat at the end of the procedure. The omentum was brought over the area of the mesh. The pneumoperitoneum was evacuated  & all trocars  were removed. The skin was reapproximated at all port sites and at the umbilical skin excision site with 4-0  Monocryl sutures in a subcuticular fashion. The skin was dressed with Steri-Strips tape and gauze.  The patient was taken to the recovery room in stable condition.    PLAN OF CARE: Discharge to home after PACU  PATIENT DISPOSITION:  PACU - hemodynamically stable.   Delay start of Pharmacological VTE agent (>24hrs) due to surgical blood loss or risk of bleeding: not applicable

## 2014-11-02 NOTE — Anesthesia Postprocedure Evaluation (Signed)
  Anesthesia Post-op Note  Patient: Jordan BearsJoseph C Montoya  Procedure(s) Performed: Procedure(s) (LRB): LAPAROSCOPIC VENTRAL HERNIA REPAIR WITH MESH (N/A) INSERTION OF MESH (N/A)  Patient Location: PACU  Anesthesia Type: General  Level of Consciousness: awake and alert   Airway and Oxygen Therapy: Patient Spontanous Breathing  Post-op Pain: mild  Post-op Assessment: Post-op Vital signs reviewed, Patient's Cardiovascular Status Stable, Respiratory Function Stable, Patent Airway and No signs of Nausea or vomiting  Last Vitals:  Filed Vitals:   11/02/14 1236  BP:   Pulse: 84  Temp:   Resp: 14    Post-op Vital Signs: stable   Complications: No apparent anesthesia complications

## 2014-11-02 NOTE — Progress Notes (Signed)
Patient up off of stretcher and ambulates to bathroom post umbilical hernia repair. States numbness in fingers has improved since ambulating and up to recliner chair.

## 2014-11-02 NOTE — Progress Notes (Signed)
Patient complains of numbness of left hand fingers- can tell they are being touched- able to move them- blanches satisfactorily- left radial pulse palpable- Dr. Okey Dupreose notified

## 2014-11-02 NOTE — H&P (Signed)
History of Present Illness (Ajia Chadderdon MD; 09/11/2014 3:51 PM) Patient words: Evaluate Umbilical hernia.  The patient is a 48 year old male who preseAxel Fillernts with an umbilical hernia. Patient is a 48 year old male is referred by Tomi BambergerSusan Fuller for evaluation of an umbilical hernia. The patient states this is been there for the last several years. He states it's got bigger and become more cumbersome. The patient has had no signs or symptoms or history of ascites and her liver failure.  The patient said no such symptoms of incarceration or strangulation.   Other Problems Axel Filler(Beyla Loney, MD; 09/11/2014 3:42 PM) Gastroesophageal Reflux Disease Kidney Stone Sleep Apnea Umbilical Hernia Repair GOUT OF KNEE (274.00  M10.9)  Diagnostic Studies History Axel Filler(Raina Sole, MD; 09/11/2014 3:35 PM) Colonoscopy never  Allergies Axel Filler(Lowry Bala, MD; 09/11/2014 3:36 PM) Percocet *ANALGESICS - OPIOID* Itching.  Medication History Axel Filler(Daren Yeagle, MD; 09/11/2014 3:37 PM) Allopurinol (100MG  Tablet, Oral) Active.  Social History Axel Filler(Elizet Kaplan, MD; 09/11/2014 3:35 PM) Alcohol use Occasional alcohol use. Caffeine use Carbonated beverages, Tea. No drug use Tobacco use Never smoker.  Family History Axel Filler(Jasmon Graffam, MD; 09/11/2014 3:35 PM) Breast Cancer Mother. Migraine Headache Sister. Thyroid problems Sister.  Review of Systems Axel Filler(Blas Riches MD; 09/11/2014 3:35 PM) General Not Present- Appetite Loss, Chills, Fatigue, Fever, Night Sweats, Weight Gain and Weight Loss. Skin Not Present- Change in Wart/Mole, Dryness, Hives, Jaundice, New Lesions, Non-Healing Wounds, Rash and Ulcer. HEENT Not Present- Earache, Hearing Loss, Hoarseness, Nose Bleed, Oral Ulcers, Ringing in the Ears, Seasonal Allergies, Sinus Pain, Sore Throat, Visual Disturbances, Wears glasses/contact lenses and Yellow Eyes. Respiratory Present- Snoring. Not Present- Bloody sputum, Chronic Cough, Difficulty  Breathing and Wheezing. Breast Not Present- Breast Mass, Breast Pain, Nipple Discharge and Skin Changes. Cardiovascular Not Present- Chest Pain, Difficulty Breathing Lying Down, Leg Cramps, Palpitations, Rapid Heart Rate, Shortness of Breath and Swelling of Extremities. Gastrointestinal Not Present- Abdominal Pain, Bloating, Bloody Stool, Change in Bowel Habits, Chronic diarrhea, Constipation, Difficulty Swallowing, Excessive gas, Gets full quickly at meals, Hemorrhoids, Indigestion, Nausea, Rectal Pain and Vomiting. Male Genitourinary Not Present- Blood in Urine, Change in Urinary Stream, Frequency, Impotence, Nocturia, Painful Urination, Urgency and Urine Leakage. Musculoskeletal Present- Joint Pain. Not Present- Back Pain, Joint Stiffness, Muscle Pain, Muscle Weakness and Swelling of Extremities. Neurological Not Present- Decreased Memory, Fainting, Headaches, Numbness, Seizures, Tingling, Tremor, Trouble walking and Weakness. Psychiatric Not Present- Anxiety, Bipolar, Change in Sleep Pattern, Depression, Fearful and Frequent crying. Endocrine Not Present- Cold Intolerance, Excessive Hunger, Hair Changes, Heat Intolerance, Hot flashes and New Diabetes. Hematology Not Present- Easy Bruising, Excessive bleeding, Gland problems, HIV and Persistent Infections.   Vitals Axel Filler(Casady Voshell MD; 09/11/2014 3:35 PM) 09/11/2014 3:35 PM Weight: 135.13 lb Height: 70in Body Surface Area: 1.74 m Body Mass Index: 19.39 kg/m Temp.: 98.29F(Tympanic)  Pulse: 88 (Regular)  Resp.: 18 (Unlabored)  BP: 130/82 (Sitting, Left Arm, Standard)    Physical Exam Axel Filler(Montrez Marietta MD; 09/11/2014 3:51 PM) General Mental Status-Alert. General Appearance-Consistent with stated age. Hydration-Well hydrated. Voice-Normal.  Head and Neck Head-normocephalic, atraumatic with no lesions or palpable masses. Trachea-midline.  Eye Eyeball - Bilateral-Extraocular movements  intact. Sclera/Conjunctiva - Bilateral-No scleral icterus.  Chest and Lung Exam Chest and lung exam reveals -quiet, even and easy respiratory effort with no use of accessory muscles and on auscultation, normal breath sounds, no adventitious sounds and normal vocal resonance. Inspection Chest Wall - Normal. Back - normal.  Cardiovascular Cardiovascular examination reveals -normal heart sounds, regular rate and rhythm with no murmurs and normal  pedal pulses bilaterally.  Abdomen Inspection Skin - Scar - no surgical scars. Hernias - Ventral - Reducible(PARTIALLY). Palpation/Percussion Palpation and Percussion of the abdomen reveal - Soft, Non Tender, No Rebound tenderness, No Rigidity (guarding) and No hepatosplenomegaly. Auscultation Auscultation of the abdomen reveals - Bowel sounds normal.  Neurologic Neurologic evaluation reveals -alert and oriented x 3 with no impairment of recent or remote memory. Mental Status-Normal.  Musculoskeletal Normal Exam - Left-Upper Extremity Strength Normal and Lower Extremity Strength Normal. Normal Exam - Right-Upper Extremity Strength Normal, Lower Extremity Weakness.    Assessment & Plan Axel Filler(Hezikiah Retzloff MD; 09/11/2014 3:54 PM) UMBILICAL HERNIA WITHOUT OBSTRUCTION AND WITHOUT GANGRENE (553.1  K42.9) Impression: 48 year old male with a large umbilical hernia.  We will proceed with a laparoscopic hernia repair.  All risks and benefits were discussed with the patient to generally include, but not limited to: infection, bleeding, damage to surrounding structures, acute and chronic nerve pain, and recurrence. Alternatives were offered and described. All questions were answered and the patient voiced understanding of the procedure and wishes to proceed at this point with hernia repair.

## 2014-11-02 NOTE — Anesthesia Preprocedure Evaluation (Signed)
Anesthesia Evaluation  Patient identified by MRN, date of birth, ID band Patient awake    Reviewed: Allergy & Precautions, H&P , NPO status , Patient's Chart, lab work & pertinent test results  Airway Mallampati: III  TM Distance: <3 FB Neck ROM: Full    Dental no notable dental hx.    Pulmonary sleep apnea , former smoker,  breath sounds clear to auscultation  + decreased breath sounds      Cardiovascular negative cardio ROS  Rhythm:Regular Rate:Normal     Neuro/Psych negative neurological ROS  negative psych ROS   GI/Hepatic negative GI ROS, Neg liver ROS,   Endo/Other  Morbid obesity  Renal/GU negative Renal ROS  negative genitourinary   Musculoskeletal  (+) Arthritis -, on steriods ,    Abdominal (+) + obese,   Peds negative pediatric ROS (+)  Hematology negative hematology ROS (+)   Anesthesia Other Findings   Reproductive/Obstetrics negative OB ROS                             Anesthesia Physical Anesthesia Plan  ASA: III  Anesthesia Plan: General   Post-op Pain Management:    Induction: Intravenous  Airway Management Planned: Oral ETT  Additional Equipment:   Intra-op Plan:   Post-operative Plan: Extubation in OR  Informed Consent: I have reviewed the patients History and Physical, chart, labs and discussed the procedure including the risks, benefits and alternatives for the proposed anesthesia with the patient or authorized representative who has indicated his/her understanding and acceptance.   Dental advisory given  Plan Discussed with: CRNA and Surgeon  Anesthesia Plan Comments:         Anesthesia Quick Evaluation

## 2014-11-02 NOTE — Discharge Instructions (Signed)
[facility] _______[facility] ° °UMBILICAL  HERNIA REPAIR: POST OP INSTRUCTIONS ° °Always review your discharge instruction sheet given to you by the facility where your surgery was performed. °IF YOU HAVE DISABILITY OR FAMILY LEAVE FORMS, YOU MUST BRING THEM TO THE OFFICE FOR PROCESSING.   °DO NOT GIVE THEM TO YOUR DOCTOR. ° °1. A  prescription for pain medication may be given to you upon discharge.  Take your pain medication as prescribed, if needed.  If narcotic pain medicine is not needed, then you may take acetaminophen (Tylenol) or ibuprofen (Advil) as needed. °2. Take your usually prescribed medications unless otherwise directed. °3. If you need a refill on your pain medication, please contact your pharmacy.  They will contact our office to request authorization. Prescriptions will not be filled after 5 pm or on week-ends. °4. You should follow a light diet the first 24 hours after arrival home, such as soup and crackers, etc.  Be sure to include lots of fluids daily.  Resume your normal diet the day after surgery. °5. Most patients will experience some swelling and bruising around the umbilicus or in the groin and scrotum.  Ice packs and reclining will help.  Swelling and bruising can take several days to resolve.  °6. It is common to experience some constipation if taking pain medication after surgery.  Increasing fluid intake and taking a stool softener (such as Colace) will usually help or prevent this problem from occurring.  A mild laxative (Milk of Magnesia or Miralax) should be taken according to package directions if there are no bowel movements after 48 hours. °7. Unless discharge instructions indicate otherwise, you may remove your bandages 24-48 hours after surgery, and you may shower at that time.  You may have steri-strips (small skin tapes) in place directly over the incision.  These strips should be left on the skin for 7-10 days.  If your surgeon used skin glue on the incision, you  may shower in 24 hours.  The glue will flake off over the next 2-3 weeks.  Any sutures or staples will be removed at the office during your follow-up visit. °8. ACTIVITIES:  You may resume regular (light) daily activities beginning the next day--such as daily self-care, walking, climbing stairs--gradually increasing activities as tolerated.  You may have sexual intercourse when it is comfortable.  Refrain from any heavy lifting or straining until approved by your doctor. °a. You may drive when you are no longer taking prescription pain medication, you can comfortably wear a seatbelt, and you can safely maneuver your car and apply brakes. °b. RETURN TO WORK:  __________________________________________________________ °9. You should see your doctor in the office for a follow-up appointment approximately 2-3 weeks after your surgery.  Make sure that you call for this appointment within a day or two after you arrive home to insure a convenient appointment time. °10. OTHER INSTRUCTIONS:  __________________________________________________________________________________________________________________________________________________________________________________________  °WHEN TO CALL YOUR DOCTOR: °1. Fever over 101.0 °2. Inability to urinate °3. Nausea and/or vomiting °4. Extreme swelling or bruising °5. Continued bleeding from incision. °6. Increased pain, redness, or drainage from the incision ° °The clinic staff is available to answer your questions during regular business hours.  Please don’t hesitate to call and ask to speak to one of the nurses for clinical concerns.  If you have a medical emergency, go to the nearest emergency room or call 911.  A surgeon from [facility] is always on call at the hospital ° ° °[address], [address], [place], [place]  [address] ? ° [address] [address], [place],    [address] °[phone] ? [phone] ? FAX [phone] °Web site:  [email or web address] ° ° °General Anesthesia, Care After °Refer to this sheet in the next few weeks. These instructions provide you with information on caring for yourself after your procedure. Your health care provider may also give you more specific instructions. Your treatment has been planned according to current medical practices, but problems sometimes occur. Call your health care provider if you have any problems or questions after your procedure. °WHAT TO EXPECT AFTER THE PROCEDURE °After the procedure, it is typical to experience: °· Sleepiness. °· Nausea and vomiting. °HOME CARE INSTRUCTIONS °· For the first 24 hours after general anesthesia: °¨ Have a responsible person with you. °¨ Do not drive a car. If you are alone, do not take public transportation. °¨ Do not drink alcohol. °¨ Do not take medicine that has not been prescribed by your health care provider. °¨ Do not sign important papers or make important decisions. °¨ You may resume a normal diet and activities as directed by your health care provider. °· Change bandages (dressings) as directed. °· If you have questions or problems that seem related to general anesthesia, call the hospital and ask for the anesthetist or anesthesiologist on call. °SEEK MEDICAL CARE IF: °· You have nausea and vomiting that continue the day after anesthesia. °· You develop a rash. °SEEK IMMEDIATE MEDICAL CARE IF:  °· You have difficulty breathing. °· You have chest pain. °· You have any allergic problems. °Document Released: 02/22/2001 Document Revised: 11/21/2013 Document Reviewed: 06/01/2013 °ExitCare® Patient Information ©2015 [employer]. This information is not intended to replace advice given to you by your health care provider. Make sure you discuss any questions you have with your health care provider. ° °

## 2014-11-05 ENCOUNTER — Encounter (HOSPITAL_COMMUNITY): Payer: Self-pay | Admitting: General Surgery

## 2015-10-09 IMAGING — CT CT ABDOMEN W/O CM
1 of 2 series · 15 of 32 positions shown, 20 images · IV contrast (30CC OMNI 300 &)
Comparison: none

[Series 3: abdomen w/o · axial · non-contrast · 0.98mm/px · z∈[-528,-13]mm · 15 of 113 slices shown, 20 images]
[im 5/113  soft-tissue]
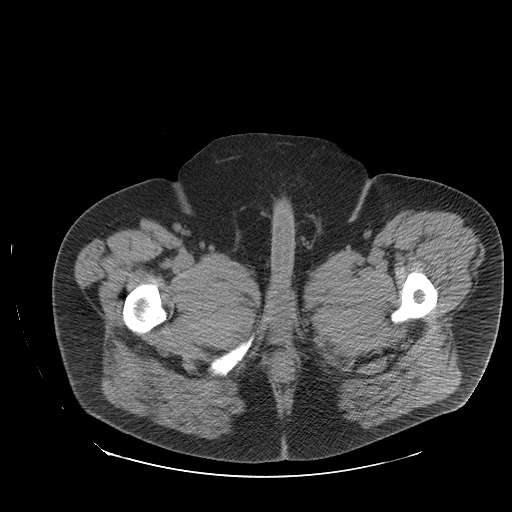
[im 5/113  bone]
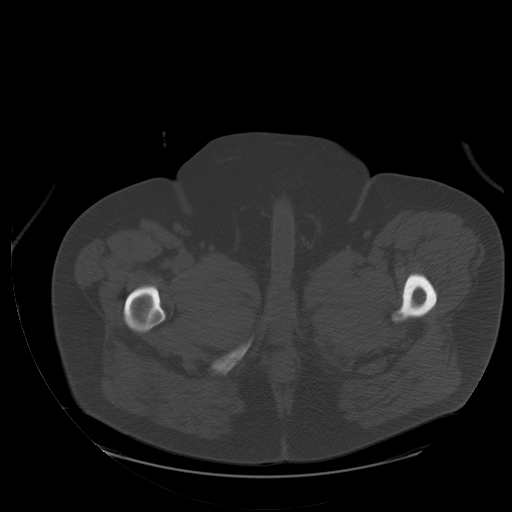
[im 15/113  soft-tissue]
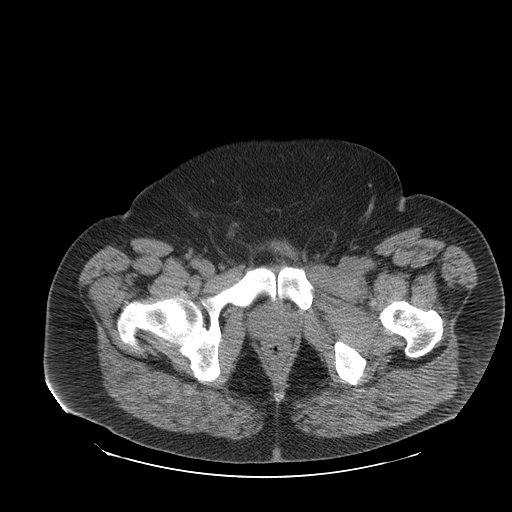
[im 20/113  soft-tissue]
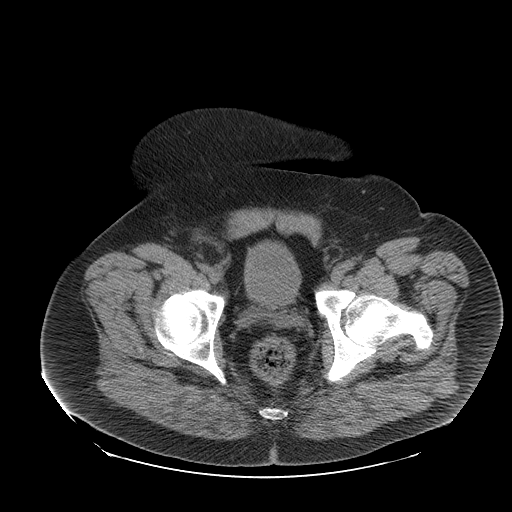
[im 30/113  soft-tissue]
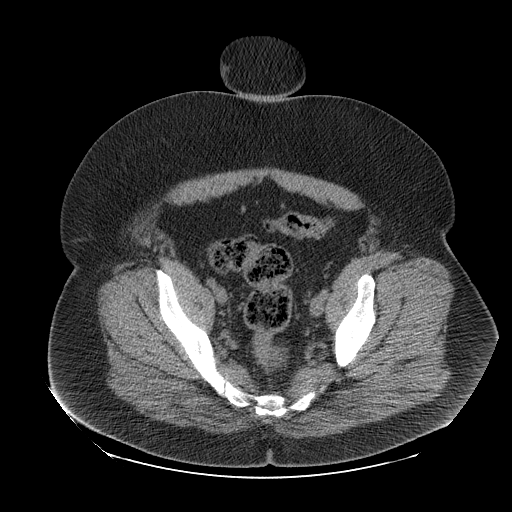
[im 39/113  soft-tissue]
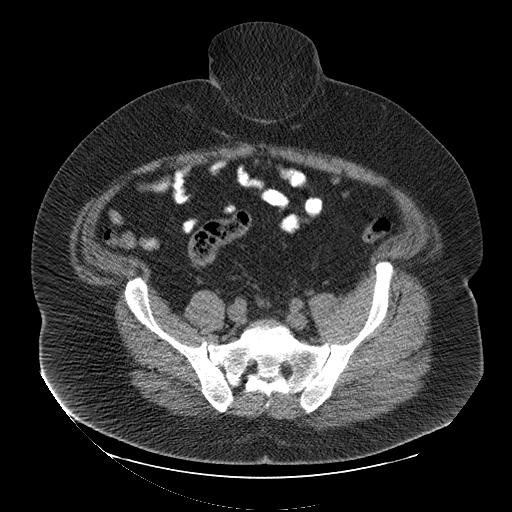
[im 44/113  soft-tissue]
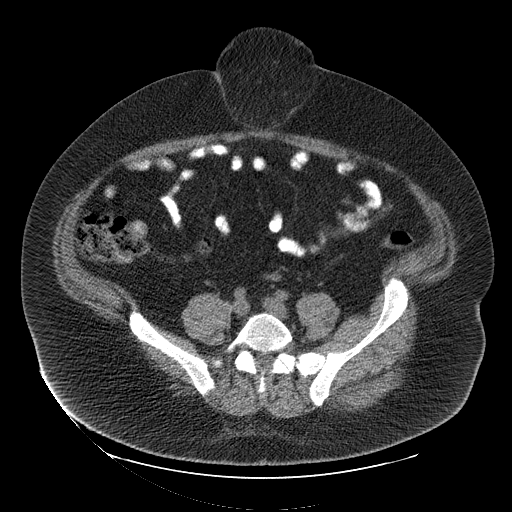
[im 54/113  soft-tissue]
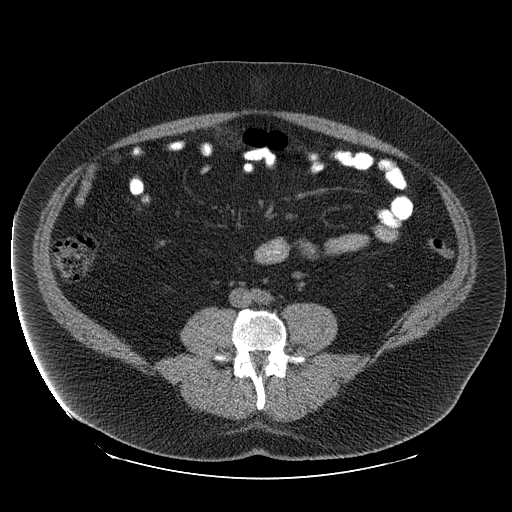
[im 59/113  soft-tissue]
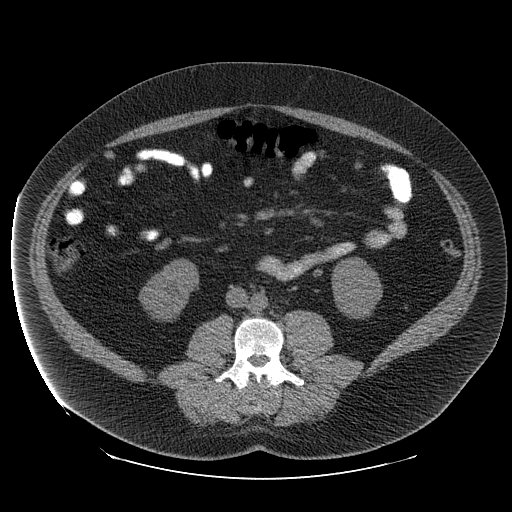
[im 69/113  soft-tissue]
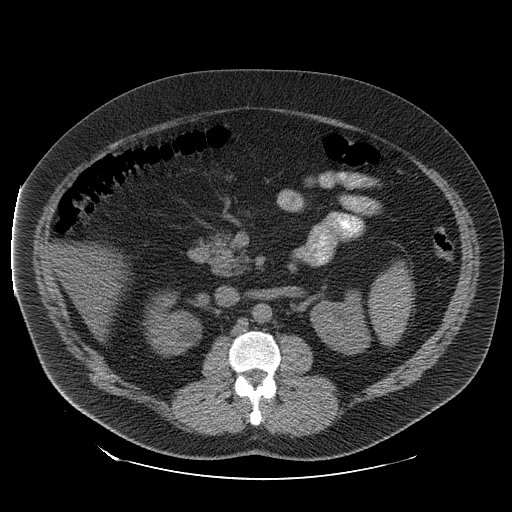
[im 69/113  bone]
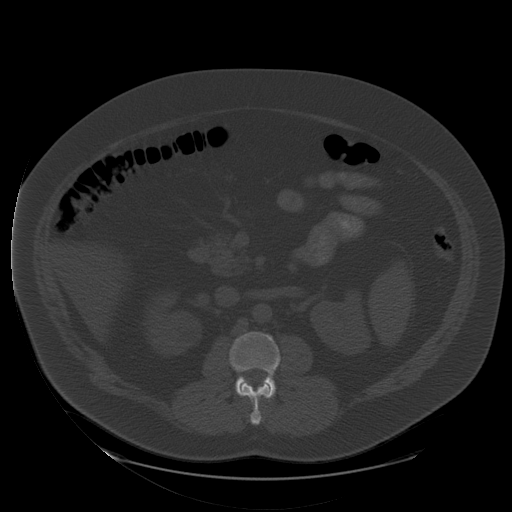
[im 74/113  soft-tissue]
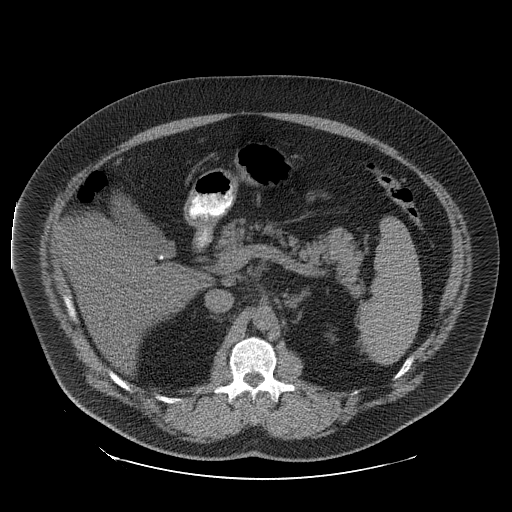
[im 83/113  soft-tissue]
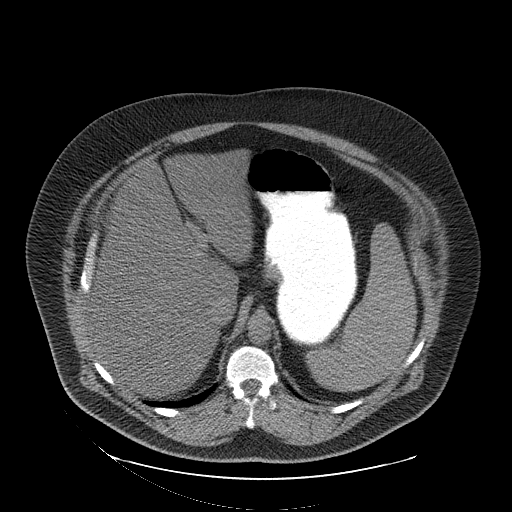
[im 93/113  soft-tissue]
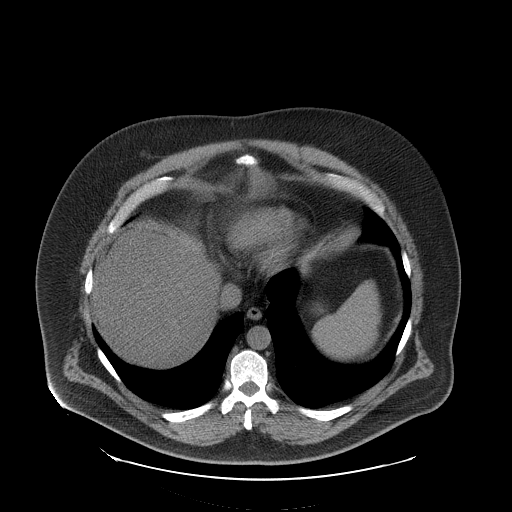
[im 93/113  lung]
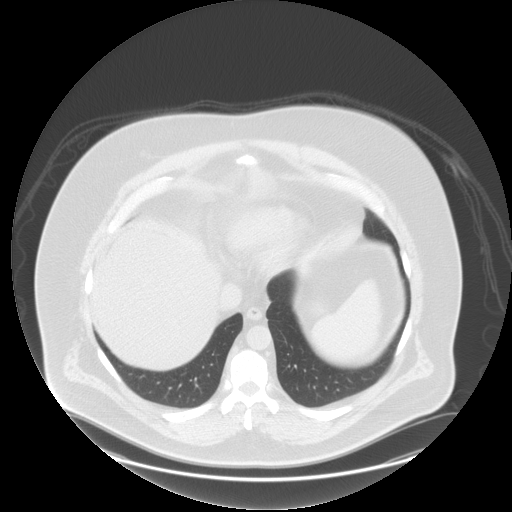
[im 98/113  soft-tissue]
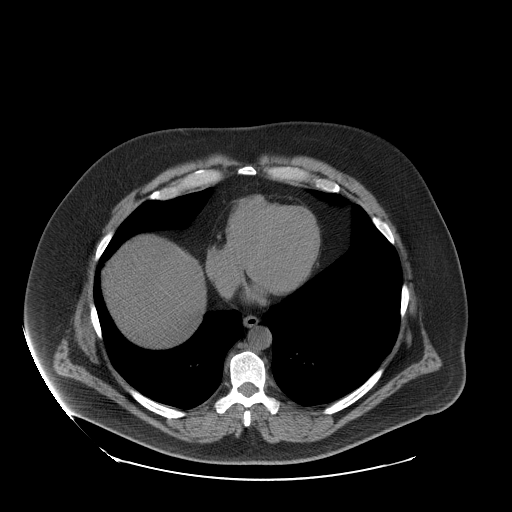
[im 98/113  lung]
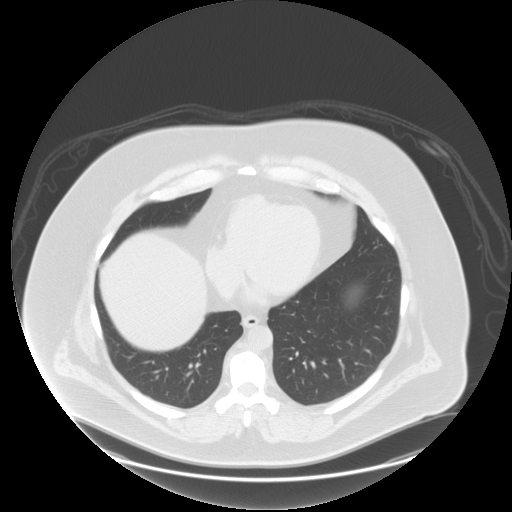
[im 103/113  lung]
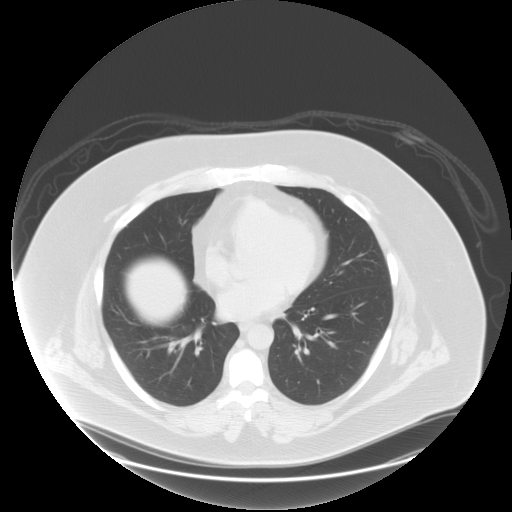
[im 108/113  soft-tissue]
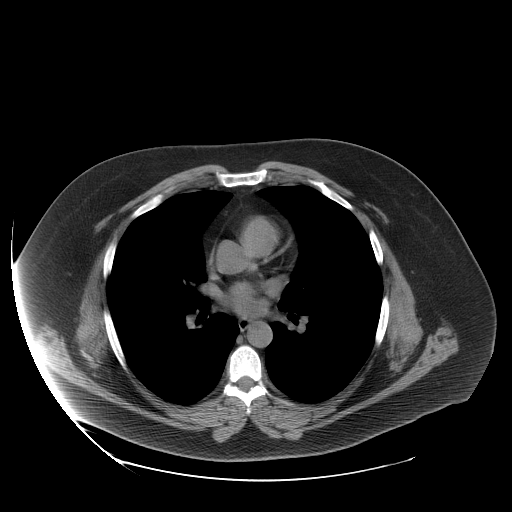
[im 108/113  lung]
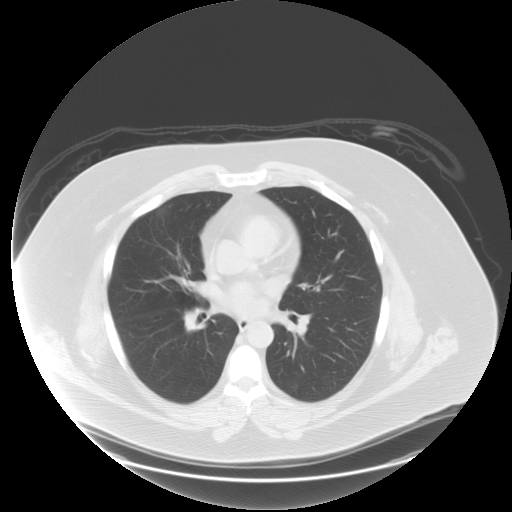

[15 of 32 positions shown; findings below may reference images not displayed]

Canned report from images found in remote index.

Refer to host system for actual result text.

## 2020-04-05 ENCOUNTER — Ambulatory Visit: Payer: Self-pay | Attending: Internal Medicine

## 2020-04-05 DIAGNOSIS — Z23 Encounter for immunization: Secondary | ICD-10-CM

## 2020-04-05 NOTE — Progress Notes (Signed)
   Covid-19 Vaccination Clinic  Name:  Jordan Daniels    MRN: [id number] DOB: [birth year]  04/05/2020  Jordan Daniels was observed post Covid-19 immunization for 15 minutes without incident. He was provided with Vaccine Information Sheet and instruction to access the V-Safe system.   Jordan Daniels was instructed to call 911 with any severe reactions post vaccine: Marland Kitchen Difficulty breathing  . Swelling of face and throat  . A fast heartbeat  . A bad rash all over body  . Dizziness and weakness   Immunizations Administered    Name Date Dose VIS Date Route   Pfizer COVID-19 Vaccine 04/05/2020  4:16 PM 0.3 mL 01/24/2019 Intramuscular   Manufacturer: [employer], [employer]   Lot: [id number]   NDC: [address]-5

## 2020-04-26 ENCOUNTER — Ambulatory Visit: Payer: Medicaid Other | Attending: Internal Medicine

## 2020-04-26 DIAGNOSIS — Z23 Encounter for immunization: Secondary | ICD-10-CM

## 2020-04-26 NOTE — Progress Notes (Signed)
   Covid-19 Vaccination Clinic  Name:  Jordan Daniels    MRN: [id number] DOB: [birth year]  04/26/2020  Jordan Daniels was observed post Covid-19 immunization for 15 minutes without incident. He was provided with Vaccine Information Sheet and instruction to access the V-Safe system.   Jordan Daniels was instructed to call 911 with any severe reactions post vaccine: Marland Kitchen Difficulty breathing  . Swelling of face and throat  . A fast heartbeat  . A bad rash all over body  . Dizziness and weakness   Immunizations Administered    Name Date Dose VIS Date Route   Pfizer COVID-19 Vaccine 04/26/2020  4:08 PM 0.3 mL 01/24/2019 Intramuscular   Manufacturer: [employer], [employer]   Lot: [id number]   NDC: [address]-1

## 2021-05-22 ENCOUNTER — Encounter (HOSPITAL_COMMUNITY): Payer: Self-pay | Admitting: Emergency Medicine

## 2021-05-22 ENCOUNTER — Emergency Department (HOSPITAL_COMMUNITY): Payer: 59

## 2021-05-22 ENCOUNTER — Inpatient Hospital Stay (HOSPITAL_COMMUNITY)
Admission: EM | Admit: 2021-05-22 | Discharge: 2021-05-24 | DRG: 640 | Disposition: A | Discharge status: Home / Self Care | Payer: 59 | Attending: Internal Medicine | Admitting: Internal Medicine

## 2021-05-22 ENCOUNTER — Other Ambulatory Visit: Payer: Self-pay

## 2021-05-22 DIAGNOSIS — Z79899 Other long term (current) drug therapy: Secondary | ICD-10-CM

## 2021-05-22 DIAGNOSIS — Z20822 Contact with and (suspected) exposure to covid-19: Secondary | ICD-10-CM | POA: Diagnosis present

## 2021-05-22 DIAGNOSIS — I509 Heart failure, unspecified: Secondary | ICD-10-CM

## 2021-05-22 DIAGNOSIS — J9601 Acute respiratory failure with hypoxia: Secondary | ICD-10-CM

## 2021-05-22 DIAGNOSIS — R0609 Other forms of dyspnea: Secondary | ICD-10-CM

## 2021-05-22 DIAGNOSIS — R0600 Dyspnea, unspecified: Secondary | ICD-10-CM | POA: Diagnosis not present

## 2021-05-22 DIAGNOSIS — E8770 Fluid overload, unspecified: Principal | ICD-10-CM | POA: Diagnosis present

## 2021-05-22 DIAGNOSIS — Z6841 Body Mass Index (BMI) 40.0 and over, adult: Secondary | ICD-10-CM

## 2021-05-22 DIAGNOSIS — M109 Gout, unspecified: Secondary | ICD-10-CM | POA: Diagnosis present

## 2021-05-22 DIAGNOSIS — Z87891 Personal history of nicotine dependence: Secondary | ICD-10-CM

## 2021-05-22 DIAGNOSIS — I272 Pulmonary hypertension, unspecified: Secondary | ICD-10-CM | POA: Diagnosis present

## 2021-05-22 DIAGNOSIS — K219 Gastro-esophageal reflux disease without esophagitis: Secondary | ICD-10-CM

## 2021-05-22 DIAGNOSIS — Z8249 Family history of ischemic heart disease and other diseases of the circulatory system: Secondary | ICD-10-CM

## 2021-05-22 LAB — COMPREHENSIVE METABOLIC PANEL
ALT: 34 U/L (ref 0–44)
AST: 24 U/L (ref 15–41)
Albumin: 3 g/dL — ABNORMAL LOW (ref 3.5–5.0)
Alkaline Phosphatase: 48 U/L (ref 38–126)
Anion gap: 11 (ref 5–15)
BUN: 14 mg/dL (ref 6–20)
CO2: 25 mmol/L (ref 22–32)
Calcium: 8.4 mg/dL — ABNORMAL LOW (ref 8.9–10.3)
Chloride: 101 mmol/L (ref 98–111)
Creatinine, Ser: 1.08 mg/dL (ref 0.61–1.24)
GFR, Estimated: >60 mL/min (ref >60)
Glucose, Bld: 142 mg/dL — ABNORMAL HIGH (ref 70–99)
Potassium: 3.7 mmol/L (ref 3.5–5.1)
Sodium: 137 mmol/L (ref 135–145)
Total Bilirubin: 1.2 mg/dL (ref 0.3–1.2)
Total Protein: 6.2 g/dL — ABNORMAL LOW (ref 6.5–8.1)

## 2021-05-22 LAB — CBC WITH DIFFERENTIAL/PLATELET
Abs Immature Granulocytes: 0.04 10*3/uL (ref 0.00–0.07)
Basophils Absolute: 0.1 10*3/uL (ref 0.0–0.1)
Basophils Relative: 1 %
Eosinophils Absolute: 0.1 10*3/uL (ref 0.0–0.5)
Eosinophils Relative: 1 %
HCT: 42.6 % (ref 39.0–52.0)
Hemoglobin: 13.1 g/dL (ref 13.0–17.0)
Immature Granulocytes: 1 %
Lymphocytes Relative: 17 %
Lymphs Abs: 1.5 10*3/uL (ref 0.7–4.0)
MCH: 28.7 pg (ref 26.0–34.0)
MCHC: 30.8 g/dL (ref 30.0–36.0)
MCV: 93.4 fL (ref 80.0–100.0)
Monocytes Absolute: 0.4 10*3/uL (ref 0.1–1.0)
Monocytes Relative: 4 %
Neutro Abs: 6.8 10*3/uL (ref 1.7–7.7)
Neutrophils Relative %: 76 %
Platelets: 195 10*3/uL (ref 150–400)
RBC: 4.56 MIL/uL (ref 4.22–5.81)
RDW: 15.3 % (ref 11.5–15.5)
WBC: 8.8 10*3/uL (ref 4.0–10.5)
nRBC: 0 % (ref 0.0–0.2)

## 2021-05-22 LAB — URINALYSIS, ROUTINE W REFLEX MICROSCOPIC
Bilirubin Urine: NEGATIVE
Glucose, UA: NEGATIVE mg/dL
Hgb urine dipstick: NEGATIVE
Ketones, ur: NEGATIVE mg/dL
Nitrite: NEGATIVE
Protein, ur: NEGATIVE mg/dL
Specific Gravity, Urine: 1.02 (ref 1.005–1.030)
pH: 5 (ref 5.0–8.0)

## 2021-05-22 LAB — BRAIN NATRIURETIC PEPTIDE: B Natriuretic Peptide: 76 pg/mL (ref 0.0–100.0)

## 2021-05-22 LAB — TROPONIN I (HIGH SENSITIVITY): Troponin I (High Sensitivity): 11 ng/L (ref <18)

## 2021-05-22 NOTE — ED Provider Notes (Signed)
Emergency Medicine Provider Triage Evaluation Note  Jordan Daniels , a 55 y.o. male  was evaluated in triage.  Pt complains of 10 to 11 days of chest congestion and shortness of breath.  He reports shortness of breath is worse with exertion.  No known COVID contacts.  He states that his father has congestive heart failure and so when both of his legs started swelling he became concerned causing him to seek care..  Review of Systems  Positive: Shortness of breath, BLE,  Negative: fever  Physical Exam  BP (!) 160/85 (BP Location: Right Arm)   Pulse 93   Temp 98.7 F (37.1 C) (Oral)   Resp (!) 24   SpO2 94%  Gen:   Awake, no distress   Resp:  Normal effort  MSK:   Moves extremities without difficulty  Other:  Obvious edema BLE, roughly same size  Medical Decision Making  Medically screening exam initiated at 9:23 PM.  Appropriate orders placed.  ELTON HEID was informed that the remainder of the evaluation will be completed by another provider, this initial triage assessment does not replace that evaluation, and the importance of remaining in the ED until their evaluation is complete.     Norman Clay 05/22/21 2124    Terald Sleeper, MD 05/23/21 Aretha Parrot

## 2021-05-22 NOTE — ED Triage Notes (Signed)
Pt c/o shortness of breath with exertion, cough, and edema to the RLE. Denies chest pain.

## 2021-05-23 ENCOUNTER — Inpatient Hospital Stay (HOSPITAL_COMMUNITY): Payer: 59

## 2021-05-23 ENCOUNTER — Encounter (HOSPITAL_COMMUNITY): Payer: Self-pay | Admitting: Internal Medicine

## 2021-05-23 DIAGNOSIS — I509 Heart failure, unspecified: Secondary | ICD-10-CM

## 2021-05-23 DIAGNOSIS — Z79899 Other long term (current) drug therapy: Secondary | ICD-10-CM | POA: Diagnosis not present

## 2021-05-23 DIAGNOSIS — K219 Gastro-esophageal reflux disease without esophagitis: Secondary | ICD-10-CM

## 2021-05-23 DIAGNOSIS — Z87891 Personal history of nicotine dependence: Secondary | ICD-10-CM | POA: Diagnosis not present

## 2021-05-23 DIAGNOSIS — Z8249 Family history of ischemic heart disease and other diseases of the circulatory system: Secondary | ICD-10-CM | POA: Diagnosis not present

## 2021-05-23 DIAGNOSIS — E8770 Fluid overload, unspecified: Secondary | ICD-10-CM | POA: Diagnosis present

## 2021-05-23 DIAGNOSIS — M109 Gout, unspecified: Secondary | ICD-10-CM | POA: Diagnosis present

## 2021-05-23 DIAGNOSIS — J9601 Acute respiratory failure with hypoxia: Secondary | ICD-10-CM

## 2021-05-23 DIAGNOSIS — R0600 Dyspnea, unspecified: Secondary | ICD-10-CM | POA: Diagnosis present

## 2021-05-23 DIAGNOSIS — Z20822 Contact with and (suspected) exposure to covid-19: Secondary | ICD-10-CM | POA: Diagnosis present

## 2021-05-23 DIAGNOSIS — Z6841 Body Mass Index (BMI) 40.0 and over, adult: Secondary | ICD-10-CM | POA: Diagnosis not present

## 2021-05-23 DIAGNOSIS — I272 Pulmonary hypertension, unspecified: Secondary | ICD-10-CM | POA: Diagnosis present

## 2021-05-23 LAB — D-DIMER, QUANTITATIVE: D-Dimer, Quant: 0.95 ug/mL-FEU — ABNORMAL HIGH (ref 0.00–0.50)

## 2021-05-23 LAB — ECHOCARDIOGRAM COMPLETE
AR max vel: 3.2 cm2
AV Area VTI: 3.19 cm2
AV Area mean vel: 3.21 cm2
AV Mean grad: 4 mmHg
AV Peak grad: 8.5 mmHg
Ao pk vel: 1.46 m/s
Area-P 1/2: 3.42 cm2
Height: 70 in
S' Lateral: 3.5 cm
Weight: 5456 oz

## 2021-05-23 LAB — RESP PANEL BY RT-PCR (FLU A&B, COVID) ARPGX2
Influenza A by PCR: NEGATIVE
Influenza B by PCR: NEGATIVE
SARS Coronavirus 2 by RT PCR: NEGATIVE

## 2021-05-23 LAB — TROPONIN I (HIGH SENSITIVITY): Troponin I (High Sensitivity): 10 ng/L (ref <18)

## 2021-05-23 LAB — HIV ANTIBODY (ROUTINE TESTING W REFLEX): HIV Screen 4th Generation wRfx: NONREACTIVE

## 2021-05-23 LAB — PROCALCITONIN: Procalcitonin: <0.1 ng/mL

## 2021-05-23 MED ORDER — FUROSEMIDE 10 MG/ML IJ SOLN
20.0000 mg | Freq: Once | INTRAMUSCULAR | Status: AC
  Administered 2021-05-23: 20 mg via INTRAVENOUS
  Filled 2021-05-23: qty 2

## 2021-05-23 MED ORDER — PERFLUTREN LIPID MICROSPHERE
1.0000 mL | INTRAVENOUS | Status: AC | PRN
  Administered 2021-05-23: 5 mL via INTRAVENOUS
  Filled 2021-05-23: qty 10

## 2021-05-23 MED ORDER — ALBUTEROL SULFATE HFA 108 (90 BASE) MCG/ACT IN AERS
4.0000 | INHALATION_SPRAY | Freq: Once | RESPIRATORY_TRACT | Status: AC
  Administered 2021-05-23: 4 via RESPIRATORY_TRACT
  Filled 2021-05-23: qty 6.7

## 2021-05-23 MED ORDER — ENOXAPARIN SODIUM 80 MG/0.8ML IJ SOSY
80.0000 mg | PREFILLED_SYRINGE | INTRAMUSCULAR | Status: DC
  Administered 2021-05-23: 80 mg via SUBCUTANEOUS
  Filled 2021-05-23: qty 0.8

## 2021-05-23 MED ORDER — IOHEXOL 350 MG/ML SOLN
75.0000 mL | Freq: Once | INTRAVENOUS | Status: AC | PRN
  Administered 2021-05-23: 75 mL via INTRAVENOUS

## 2021-05-23 MED ORDER — FUROSEMIDE 10 MG/ML IJ SOLN
20.0000 mg | Freq: Two times a day (BID) | INTRAMUSCULAR | Status: DC
  Administered 2021-05-23 – 2021-05-24 (×3): 20 mg via INTRAVENOUS
  Filled 2021-05-23 (×5): qty 2

## 2021-05-23 NOTE — ED Provider Notes (Signed)
[facility]   CSN: [id number] Arrival date & time: 05/22/21  2107     History Chief Complaint  Patient presents with   Shortness of Breath    Jordan BearsJoseph C Daniels is a 55 y.o. male.  Patient with a history of obesity, questionable sleep apnea presenting with a 1 week history of increasing shortness of breath, chest congestion, dyspnea with exertion.  2 days of increased leg swelling.  States he felt a "chest cold" about 1 week ago and was seen at urgent care.  He was treated with steroids and antibiotics for questionable bronchitis versus pneumonia.  He states he finished this medication.  Since then he is having increasing shortness of breath especially with exertion and.  Nonproductive cough.  Swelling of his legs over the past 2 days which concerned him due to family history of heart failure.  Has never been diagnosed with heart failure himself.  Denies chest pain.  Denies fever.  Denies abdominal pain, nausea or vomiting.  Normally sleeps on his side without difficulty.  Is never been tested for sleep apnea.  Leg swelling is worsened over the past 2 days.  States he did have a negative COVID test.  The history is provided by the patient.  Shortness of Breath Associated symptoms: cough   Associated symptoms: no abdominal pain, no chest pain, no fever, no headaches and no vomiting       Past Medical History:  Diagnosis Date   Arthritis    ankles   GERD (gastroesophageal reflux disease)    occasional   Gout    History of kidney stones    Sleep apnea    "never diagnosed-but pretty sure"    Patient Active Problem List   Diagnosis Date Noted   Gout 09/08/2012    Past Surgical History:  Procedure Laterality Date   EYE SURGERY Right 40 years ago   "lazy eye correction"   INSERTION OF MESH N/A 11/02/2014   Procedure: INSERTION OF MESH;  Surgeon: Axel FillerArmando Ramirez, MD;  Location: [facility];  Service: General;  Laterality: N/A;    KIDNEY STONE SURGERY Right 15 years ago   VENTRAL HERNIA REPAIR N/A 11/02/2014   Procedure: LAPAROSCOPIC VENTRAL HERNIA REPAIR WITH MESH;  Surgeon: Axel FillerArmando Ramirez, MD;  Location: [facility];  Service: General;  Laterality: N/A;       No family history on file.  Social History   Tobacco Use   Smoking status: Former    Pack years: 0.00    Types: Cigarettes   Smokeless tobacco: Never   Tobacco comments:    "casual smoker 25 years ago"  Substance Use Topics   Alcohol use: Yes    Comment: very rare   Drug use: No    Home Medications Prior to Admission medications   Medication Sig Start Date End Date Taking? Authorizing Provider  acetaminophen (TYLENOL) 500 MG tablet Take 1,000 mg by mouth every 6 (six) hours as needed for mild pain or moderate pain.    [provider]  allopurinol (ZYLOPRIM) 100 MG tablet Take 100 mg by mouth daily as needed (for gout flare ups).     [provider]  HYDROcodone-acetaminophen (NORCO/VICODIN) 5-325 MG per tablet Take 1 tablet by mouth every 6 (six) hours as needed for moderate pain.    [provider]  indomethacin (INDOCIN) 50 MG capsule Take 1 capsule (50 mg total) by mouth 3 (three) times daily with meals. 09/08/12   Godfrey PickEgan, Eleanore E,  PA-C  naproxen sodium (ANAPROX) 220 MG tablet Take 220 mg by mouth 2 (two) times daily as needed (for pain).    [provider]  oxyCODONE-acetaminophen (PERCOCET) 7.5-325 MG per tablet Take 1 tablet by mouth every 4 (four) hours as needed for pain. 11/02/14   Axel Filler, MD  predniSONE (DELTASONE) 10 MG tablet Take 10 mg by mouth daily with breakfast. 10/23/14   [provider]    Allergies    Percocet [oxycodone-acetaminophen]  Review of Systems   Review of Systems  Constitutional:  Negative for activity change, appetite change, fatigue and fever.  HENT:  Negative for congestion and rhinorrhea.   Respiratory:  Positive for cough and shortness of breath. Negative for  chest tightness.   Cardiovascular:  Positive for leg swelling. Negative for chest pain.  Gastrointestinal:  Negative for abdominal pain, nausea and vomiting.  Genitourinary:  Negative for dysuria and hematuria.  Musculoskeletal:  Negative for arthralgias and myalgias.  Skin:  Negative for wound.  Neurological:  Negative for dizziness, weakness and headaches.   all other systems are negative except as noted in the HPI and PMH.   Physical Exam Updated Vital Signs BP (!) 153/89   Pulse 74   Temp 98.7 F (37.1 C) (Oral)   Resp 19   SpO2 94%   Physical Exam Vitals and nursing note reviewed.  Constitutional:      General: He is not in acute distress.    Appearance: He is well-developed. He is obese.     Comments: Dyspnea with conversation.  HENT:     Head: Normocephalic and atraumatic.     Mouth/Throat:     Pharynx: No oropharyngeal exudate.  Eyes:     Conjunctiva/sclera: Conjunctivae normal.     Pupils: Pupils are equal, round, and reactive to light.  Neck:     Comments: No meningismus. Cardiovascular:     Rate and Rhythm: Normal rate and regular rhythm.     Heart sounds: Normal heart sounds. No murmur heard. Pulmonary:     Effort: Pulmonary effort is normal. No respiratory distress.     Breath sounds: Normal breath sounds.  Chest:     Chest wall: No tenderness.  Abdominal:     Palpations: Abdomen is soft.     Tenderness: There is no abdominal tenderness. There is no guarding or rebound.  Musculoskeletal:        General: No tenderness. Normal range of motion.     Cervical back: Normal range of motion and neck supple.     Right lower leg: Edema present.     Left lower leg: Edema present.  Skin:    General: Skin is warm.     Capillary Refill: Capillary refill takes less than 2 seconds.     Findings: No rash.  Neurological:     General: No focal deficit present.     Mental Status: He is alert and oriented to person, place, and time. Mental status is at baseline.      Cranial Nerves: No cranial nerve deficit.     Motor: No abnormal muscle tone.     Coordination: Coordination normal.     Comments: No ataxia on finger to nose bilaterally. No pronator drift. 5/5 strength throughout. CN 2-12 intact.Equal grip strength. Sensation intact.   Psychiatric:        Behavior: Behavior normal.    ED Results / Procedures / Treatments   Labs (all labs ordered are listed, but only abnormal results are displayed) Labs  Reviewed  COMPREHENSIVE METABOLIC PANEL - Abnormal; Notable for the following components:      Result Value   Glucose, Bld 142 (*)    Calcium 8.4 (*)    Total Protein 6.2 (*)    Albumin 3.0 (*)    All other components within normal limits  URINALYSIS, ROUTINE W REFLEX MICROSCOPIC - Abnormal; Notable for the following components:   Leukocytes,Ua SMALL (*)    Bacteria, UA RARE (*)    All other components within normal limits  D-DIMER, QUANTITATIVE - Abnormal; Notable for the following components:   D-Dimer, Quant 0.95 (*)    All other components within normal limits  RESP PANEL BY RT-PCR (FLU A&B, COVID) ARPGX2  CBC WITH DIFFERENTIAL/PLATELET  BRAIN NATRIURETIC PEPTIDE  HIV ANTIBODY (ROUTINE TESTING W REFLEX)  PROCALCITONIN  TROPONIN I (HIGH SENSITIVITY)  TROPONIN I (HIGH SENSITIVITY)    EKG EKG Interpretation  Date/Time:  Thursday May 22 2021 21:13:20 EDT Ventricular Rate:  92 PR Interval:  136 QRS Duration: 82 QT Interval:  370 QTC Calculation: 457 R Axis:   52 Text Interpretation: Normal sinus rhythm Normal ECG No previous ECGs available Confirmed by Glynn Octave [id number]) on 05/23/2021 12:51:22 AM  Radiology DG Chest 2 View  Result Date: 05/22/2021 CLINICAL DATA:  Shortness of breath with exertion. Cough. Lower extremity edema. EXAM: CHEST - 2 VIEW COMPARISON:  None. FINDINGS: Eventration of the right hemidiaphragm. Heart size upper normal. There are patchy right suprahilar opacities. Mild peribronchial thickening. No pleural  fluid or pneumothorax. No acute osseous abnormalities are seen. IMPRESSION: 1. Borderline cardiomegaly. Mild peribronchial thickening, may be bronchitis or pulmonary edema. 2. Patchy right suprahilar opacity. Atelectasis versus pneumonia. Recommend radiographic follow-up in 3-4 weeks to assess for change or resolution. Electronically Signed   By: Narda Rutherford M.D.   On: 05/22/2021 22:12    Procedures Procedures   Medications Ordered in ED Medications  albuterol (VENTOLIN HFA) 108 (90 Base) MCG/ACT inhaler 4 puff (has no administration in time range)    ED Course  I have reviewed the triage vital signs and the nursing notes.  Pertinent labs & imaging results that were available during my care of the patient were reviewed by me and considered in my medical decision making (see chart for details).    MDM Rules/Calculators/A&P                         1 week of progressive dyspnea with exertion and shortness of breath with leg swelling.  Dyspnea with conversation but no hypoxia. Clear lungs on exam.  EKG nonischemic. Chest x-ray shows cardiomegaly with questionable peribronchial thickening and right suprahilar opacity.  Troponin negative x2.  Patient able to ambulate desaturate to 88%.  Concern for early and new onset CHF.  D-dimer slightly elevated 0.95.  Will obtain CT PE.  No exertional chest pain but does have shortness of breath with leg swelling and exertional dyspnea.  Concern for possibly early and new onset CHF.  Patient does have dyspnea with exertion and hypoxia.  D-dimer elevated.  CT PE study pending at time of admission. Troponin remains negative. X-ray suggests suprahilar opacity but doubt pneumonia and favor edema.  Patient with no productive cough or fever.  Admission discussed with Dr. Loney Loh. Final Clinical Impression(s) / ED Diagnoses Final diagnoses:  Dyspnea on exertion    Rx / DC Orders ED Discharge Orders     None        Teren Zurcher, Jeannett Senior,  MD 05/23/21 [phone]

## 2021-05-23 NOTE — ED Notes (Signed)
Attempted report x1. 

## 2021-05-23 NOTE — ED Notes (Signed)
Dr. Rathore at bedside 

## 2021-05-23 NOTE — ED Notes (Signed)
Pt given cup of water per request and MD approval

## 2021-05-23 NOTE — ED Notes (Signed)
Attempted report x 2 

## 2021-05-23 NOTE — Progress Notes (Signed)
PROGRESS NOTE  Brief Narrative: Jordan Daniels is a 55 y.o. male with a history of obesity, gout, GERD, and tobacco use in the past who presented to the ED with increasing leg edema and exertional dyspnea found to be mildly hypoxic. He had been recovering from "chest congestion" a week before. CXR showed cardiomegaly and peribronchial thickening and atelectasis. Work up was otherwise reassuring with negative troponin, WBC 8.8k, BNP wnl, ECG nonischemic, covid and flu negative. D-dimer 0.95 and subsequent CTA chest revealed no pulmonary embolism. There was no consolidation, though right-sided atelectasis with peribronchial thickening was noted. Procalcitonin is negative. IV lasix has been given at admission earlier this morning and echocardiogram is pending.   Subjective: Feels swelling in legs is improving slightly, reports brisk urine output, still slightly hypoxic when walking in the hall today into 80%'s. No chest pain, palpitations. Dad has CHF and CAD which is how he knew leg swelling suggests need for evaluation.  Objective: BP 139/80 (BP Location: Right Arm)   Pulse 71   Temp 98.2 F (36.8 C) (Oral)   Resp 18   Ht 5\' 10"  (1.778 m)   Wt (!) 154.7 kg   SpO2 97%   BMI 48.93 kg/m   Gen: Obese pleasant male in no distress. Pulm: Clear and nonlabored at rest, tachypneic w/rate 22/min at this time.   CV: RRR, no murmur, UTD JVD. 2+ pitting LE dependent edema symmetrically.  GI: Soft, NT, ND, +BS  Neuro: Alert and oriented. No focal deficits. Skin: No rashes, lesions or ulcers   Assessment & Plan: Morbidly obese 55yo M w/peripheral dependent edema and newly identified exertional hypoxia. No PE on CTA chest. No consolidation and PCT negative arguing against bacterial PNA. Abnormalities on imaging may be related to recent respiratory symptoms resolving.  - Echocardiogram pending - Having subjectively brisk UOP to IV lasix which we will continue today, monitor I/O (states he's up to 341lbs  from recent check at 330lbs), and BMP in AM.  - Check pulse oximetry with ambulation in AM - Would certainly benefit from sleep study as outpatient.   Gout: Quiescent. Watch for flare with diuresis.  Otherwise, per H&P this AM by Dr. 55yo, MD Pager on [facility] 05/23/2021, 12:05 PM

## 2021-05-23 NOTE — ED Notes (Signed)
Pt O2 stayed at 89-96% while ambulating out side of room with pulse 93-106 - pt does not endorse SOB while walking

## 2021-05-23 NOTE — H&P (Signed)
History and Physical    Jordan Daniels PPI:[id number] DOB: Jan 10, 1966 DOA: 05/22/2021  PCP: [facility] Patient coming from: Home  Chief Complaint: Shortness of breath  HPI: Jordan Daniels is a 55 y.o. male with medical history significant of morbid obesity (BMI 40.3), GERD, gout, nephrolithiasis, former tobacco use presented to the ED with complaints of dyspnea on exertion and bilateral lower extremity edema.  Not hypoxic at rest but desatted to 89% with ambulation.  Labs showing WBC 8.8, hemoglobin 13.1, platelet count 195K.  Sodium 137, potassium 3.7, chloride 101, bicarb 25, BUN 14, creatinine 1.0, glucose 142.  High-sensitivity troponin negative x2.  EKG without acute ischemic changes.  BNP within normal range.  D-dimer 0.95.  COVID and influenza PCR pending.  Chest x-ray showing borderline cardiomegaly, mild peribronchial thickening, may be bronchitis or pulmonary edema.  Also showing patchy right suprahilar opacity suspicious for atelectasis versus pneumonia.  CTA chest ordered to rule out PE and pending. Patient was given albuterol inhaler treatment and IV Lasix 20 mg.  Patient states he had chest congestion and cough last week and saw his doctor.  He feels that the congestion and cough are now better but he has started experiencing shortness of breath with exertion. Also his legs have been swollen for the past 3 days which is something new as he has never had swelling in his legs before.  Denies fevers or chest pain.  Denies personal history of heart or lung problems.  Reports family history of congestive heart failure (father) and coronary artery disease (father and grandfather).  He has received 2 doses of the COVID-vaccine but no booster dose yet.  No other complaints.  Review of Systems:  All systems reviewed and apart from history of presenting illness, are negative.  Past Medical History:  Diagnosis Date   Arthritis    ankles   GERD (gastroesophageal reflux  disease)    occasional   Gout    History of kidney stones    Sleep apnea    "never diagnosed-but pretty sure"    Past Surgical History:  Procedure Laterality Date   EYE SURGERY Right 40 years ago   "lazy eye correction"   INSERTION OF MESH N/A 11/02/2014   Procedure: INSERTION OF MESH;  Surgeon: Axel Filler, MD;  Location: [facility];  Service: General;  Laterality: N/A;   KIDNEY STONE SURGERY Right 15 years ago   VENTRAL HERNIA REPAIR N/A 11/02/2014   Procedure: LAPAROSCOPIC VENTRAL HERNIA REPAIR WITH MESH;  Surgeon: Axel Filler, MD;  Location: [facility];  Service: General;  Laterality: N/A;     reports that he has quit smoking. His smoking use included cigarettes. He has never used smokeless tobacco. He reports current alcohol use. He reports that he does not use drugs.  Allergies  Allergen Reactions   Percocet [Oxycodone-Acetaminophen] Itching    Family History  Problem Relation Age of Onset   Heart failure Father    Coronary artery disease Father    Coronary artery disease Paternal Grandfather     Prior to Admission medications   Medication Sig Start Date End Date Taking? Authorizing Provider  acetaminophen (TYLENOL) 500 MG tablet Take 1,000 mg by mouth every 6 (six) hours as needed for mild pain or moderate pain.   Yes [provider]  allopurinol (ZYLOPRIM) 100 MG tablet Take 100 mg by mouth daily as needed (for gout flare ups).    Yes [provider]  benzonatate (TESSALON) 100 MG capsule Take  100-200 mg by mouth 3 (three) times daily as needed for cough. 05/09/21  Yes [provider]  naproxen sodium (ANAPROX) 220 MG tablet Take 220 mg by mouth 2 (two) times daily as needed (for pain).   Yes [provider]  doxycycline (VIBRAMYCIN) 100 MG capsule Take 100 mg by mouth See admin instructions. Bid x 10 days Patient not taking: Reported on 05/23/2021 05/09/21   [provider]  indomethacin (INDOCIN) 50 MG capsule Take 1 capsule  (50 mg total) by mouth 3 (three) times daily with meals. Patient not taking: Reported on 05/23/2021 09/08/12   Godfrey Pick, PA-C  oxyCODONE-acetaminophen (PERCOCET) 7.5-325 MG per tablet Take 1 tablet by mouth every 4 (four) hours as needed for pain. Patient not taking: No sig reported 11/02/14   Axel Filler, MD  predniSONE (DELTASONE) 50 MG tablet Take 50 mg by mouth See admin instructions. Qd x 5 days Patient not taking: Reported on 05/23/2021 05/09/21   [provider]    Physical Exam: Vitals:   05/23/21 0525 05/23/21 0528 05/23/21 0556 05/23/21 0612  BP: (!) 136/94   140/83  Pulse: 75 78  81  Resp: 13 19  18   Temp:   98 F (36.7 C) 98.5 F (36.9 C)  TempSrc:   Oral Oral  SpO2: 90% 94%  96%  Weight:    (!) 154.7 kg  Height:    5\' 10"  (1.778 m)    Physical Exam Constitutional:      General: He is not in acute distress. HENT:     Head: Normocephalic and atraumatic.  Eyes:     Extraocular Movements: Extraocular movements intact.     Conjunctiva/sclera: Conjunctivae normal.  Cardiovascular:     Rate and Rhythm: Normal rate and regular rhythm.     Pulses: Normal pulses.  Pulmonary:     Effort: Pulmonary effort is normal. No respiratory distress.     Breath sounds: No wheezing.  Abdominal:     General: Bowel sounds are normal.     Palpations: Abdomen is soft.     Tenderness: There is no abdominal tenderness. There is no guarding.  Musculoskeletal:     Cervical back: Normal range of motion and neck supple.     Comments: +3 pitting edema of bilateral lower extremities  Skin:    General: Skin is warm and dry.  Neurological:     General: No focal deficit present.     Mental Status: He is alert and oriented to person, place, and time.     Labs on Admission: I have personally reviewed following labs and imaging studies  CBC: Recent Labs  Lab 05/22/21 2123  WBC 8.8  NEUTROABS 6.8  HGB 13.1  HCT 42.6  MCV 93.4  PLT 195   Basic Metabolic  Panel: Recent Labs  Lab 05/22/21 2123  NA 137  K 3.7  CL 101  CO2 25  GLUCOSE 142*  BUN 14  CREATININE 1.08  CALCIUM 8.4*   GFR: Estimated Creatinine Clearance: 114.2 mL/min (by C-G formula based on SCr of 1.08 mg/dL). Liver Function Tests: Recent Labs  Lab 05/22/21 2123  AST 24  ALT 34  ALKPHOS 48  BILITOT 1.2  PROT 6.2*  ALBUMIN 3.0*   No results for input(s): LIPASE, AMYLASE in the last 168 hours. No results for input(s): AMMONIA in the last 168 hours. Coagulation Profile: No results for input(s): INR, PROTIME in the last 168 hours. Cardiac Enzymes: No results for input(s): CKTOTAL, CKMB, CKMBINDEX, TROPONINI  in the last 168 hours. BNP (last 3 results) No results for input(s): PROBNP in the last 8760 hours. HbA1C: No results for input(s): HGBA1C in the last 72 hours. CBG: No results for input(s): GLUCAP in the last 168 hours. Lipid Profile: No results for input(s): CHOL, HDL, LDLCALC, TRIG, CHOLHDL, LDLDIRECT in the last 72 hours. Thyroid Function Tests: No results for input(s): TSH, T4TOTAL, FREET4, T3FREE, THYROIDAB in the last 72 hours. Anemia Panel: No results for input(s): VITAMINB12, FOLATE, FERRITIN, TIBC, IRON, RETICCTPCT in the last 72 hours. Urine analysis:    Component Value Date/Time   COLORURINE YELLOW 05/22/2021 2143   APPEARANCEUR CLEAR 05/22/2021 2143   LABSPEC 1.020 05/22/2021 2143   PHURINE 5.0 05/22/2021 2143   GLUCOSEU NEGATIVE 05/22/2021 2143   HGBUR NEGATIVE 05/22/2021 2143   BILIRUBINUR NEGATIVE 05/22/2021 2143   KETONESUR NEGATIVE 05/22/2021 2143   PROTEINUR NEGATIVE 05/22/2021 2143   NITRITE NEGATIVE 05/22/2021 2143   LEUKOCYTESUR SMALL (A) 05/22/2021 2143    Radiological Exams on Admission: DG Chest 2 View  Result Date: 05/22/2021 CLINICAL DATA:  Shortness of breath with exertion. Cough. Lower extremity edema. EXAM: CHEST - 2 VIEW COMPARISON:  None. FINDINGS: Eventration of the right hemidiaphragm. Heart size upper normal.  There are patchy right suprahilar opacities. Mild peribronchial thickening. No pleural fluid or pneumothorax. No acute osseous abnormalities are seen. IMPRESSION: 1. Borderline cardiomegaly. Mild peribronchial thickening, may be bronchitis or pulmonary edema. 2. Patchy right suprahilar opacity. Atelectasis versus pneumonia. Recommend radiographic follow-up in 3-4 weeks to assess for change or resolution. Electronically Signed   By: Narda Rutherford M.D.   On: 05/22/2021 22:12    EKG: Independently reviewed.  Sinus rhythm, no prior tracing for comparison.  Assessment/Plan Principal Problem:   New onset of congestive heart failure (HCC) Active Problems:   Gout   Acute hypoxemic respiratory failure (HCC)   Morbid obesity (HCC)   GERD (gastroesophageal reflux disease)   Acute hypoxemic respiratory failure secondary to suspected new onset CHF Not hypoxic at rest but desatted to 89% with ambulation.  Does appear volume overloaded on exam.  BNP likely falsely low in the setting of morbid obesity. Chest x-ray showing borderline cardiomegaly, mild peribronchial thickening, may be bronchitis or pulmonary edema.  Also showing patchy right suprahilar opacity which is likely due to atelectasis.  Pneumonia less likely as he is not coughing much, not having fevers, and labs showing no leukocytosis.  COVID and influenza PCR negative. -Continue diuresis with IV Lasix 20 mg twice daily.  Monitor intake and output, daily weights.  Low-sodium diet with fluid restriction.  Echocardiogram ordered.  Check procalcitonin level.  Given slightly elevated D-dimer, CT angiogram chest was ordered in the ED to rule out PE and is currently pending.  Continuous pulse ox, supplemental oxygen as needed to keep oxygen saturation above 92%.  Morbid obesity (BMI 40.3) -Encourage lifestyle modifications such as healthy diet and exercise to achieve weight loss.  Gout Stable.  Not taking any medications at home.  GERD Stable.  Not  taking any medications at home.  DVT prophylaxis: Lovenox Code Status: Full code Family Communication: No family available at this time. Disposition Plan: Status is: Inpatient  Remains inpatient appropriate because:Inpatient level of care appropriate due to severity of illness  Dispo: The patient is from: Home              Anticipated d/c is to: Home              Patient currently is  not medically stable to d/c.   Difficult to place patient No  Level of care: Level of care: Telemetry Cardiac  The medical decision making on this patient was of high complexity and the patient is at high risk for clinical deterioration, therefore this is a level 3 visit.  John GiovanniVasundhra Kayshawn Ozburn MD [facility]  If 7PM-7AM, please contact night-coverage [email or web address]  05/23/2021, 6:22 AM

## 2021-05-24 LAB — BASIC METABOLIC PANEL
Anion gap: 7 (ref 5–15)
BUN: 17 mg/dL (ref 6–20)
CO2: 32 mmol/L (ref 22–32)
Calcium: 8.9 mg/dL (ref 8.9–10.3)
Chloride: 101 mmol/L (ref 98–111)
Creatinine, Ser: 1.19 mg/dL (ref 0.61–1.24)
GFR, Estimated: >60 mL/min (ref >60)
Glucose, Bld: 118 mg/dL — ABNORMAL HIGH (ref 70–99)
Potassium: 3.9 mmol/L (ref 3.5–5.1)
Sodium: 140 mmol/L (ref 135–145)

## 2021-05-24 MED ORDER — POTASSIUM CHLORIDE CRYS ER 10 MEQ PO TBCR: 10.0000 meq | EXTENDED_RELEASE_TABLET | Freq: Every day | ORAL | 0 refills | Status: AC

## 2021-05-24 MED ORDER — FUROSEMIDE 40 MG PO TABS: 40.0000 mg | ORAL_TABLET | Freq: Every day | ORAL | 3 refills | Status: AC

## 2021-05-24 NOTE — Discharge Summary (Addendum)
Physician Discharge Summary  SATVIK PARCO PJA:[id number] DOB: 09-07-1966 DOA: 05/22/2021  PCP: [facility]  Admit date: 05/22/2021 Discharge date: 06/15/2021  Admitted From: home Disposition:  home  Recommendations for Outpatient Follow-up:  Follow up with PCP in 1-2 weeks Please obtain BMP/CBC in one week Please set up for sleep study to assess for obstructive sleep apnea Follow up on patient's need for ongoing diuretic therapy  Home Health: No  Equipment/Devices: None   Discharge Condition: Stable  CODE STATUS: Full   Diet recommendation: Heart Healthy (low sodium)     Discharge Diagnoses: Active Problems:   Gout   Acute hypoxemic respiratory failure (HCC)   Morbid obesity (HCC)   GERD (gastroesophageal reflux disease)   Dyspnea    Summary of HPI and Hospital Course:  Per Dr. Jarvis Newcomer: "Jordan Daniels is a 55 y.o. male with a history of obesity, gout, GERD, and tobacco use in the past who presented to the ED with increasing leg edema and exertional dyspnea found to be mildly hypoxic. He had been recovering from "chest congestion" a week before. CXR showed cardiomegaly and peribronchial thickening and atelectasis. Work up was otherwise reassuring with negative troponin, WBC 8.8k, BNP wnl, ECG nonischemic, covid and flu negative. D-dimer 0.95 and subsequent CTA chest revealed no pulmonary embolism. There was no consolidation, though right-sided atelectasis with peribronchial thickening was noted. Procalcitonin is negative..."  Patient was admitted and treated for presumed new onset CHF.   Echo 05/23/21 unremarkable - EF 60-65%, normal diastolic parameters, normal RV systolic function. Rules out CHF at this time. Appears was mildly volume overloaded and he clinically improved with diuresis. Question underlying pulmonary HTN due to ?OSA and/or obesity.  Diuresed with IV Lasix 20 mg BID with improvement in edema, hypoxia resolved quickly, appropriate  weight loss.  Discharged with Lasix 40 mg PO daily (extra tablet if weight gain or increased swelling). Potassium 10 mEq daily to prevent hypokalemia.    Pt getting established with new PCP in late July.   Request lab appointment in 1-2 weeks.       Discharge Instructions   Discharge Instructions     (HEART FAILURE PATIENTS) Call MD:  Anytime you have any of the following symptoms: 1) 3 pound weight gain in 24 hours or 5 pounds in 1 week 2) shortness of breath, with or without a dry hacking cough 3) swelling in the hands, feet or stomach 4) if you have to sleep on extra pillows at night in order to breathe.   Complete by: As directed    Call MD for:  extreme fatigue   Complete by: As directed    Call MD for:  persistant dizziness or light-headedness   Complete by: As directed    Call MD for:  persistant nausea and vomiting   Complete by: As directed    Call MD for:  severe uncontrolled pain   Complete by: As directed    Call MD for:  temperature >100.4   Complete by: As directed    Diet - low sodium heart healthy   Complete by: As directed    Discharge instructions   Complete by: As directed    Please take Lasix (aka furosemide) 40 mg ONCE daily.  If you see: -- Weight Gain of 3+ pounds over 24 hours OR  --Weight Gain of 5+ pounds over 3 days OR --Increasing leg swelling / pitting  Then take an extra tablet of Lasix  Please call your [place] primary  care doctor's office to request having labs checked in about 1-2 weeks. This will be before your first visit, but they should accommodate you for a LAB visit. Request BASIC METABOLIC PANEL (checks electrolytes like potassium, and kidney function).  Once you see your new Primary Care Provider, please be sure they get you set up for a sleep study. You may have underlying Sleep Apnea causing some strain on Right side of the heart - this leads to leg swelling and fluid retention ("volume overload").   Increase activity slowly    Complete by: As directed       Allergies as of 05/24/2021       Reactions   Percocet [oxycodone-acetaminophen] Itching        Medication List     STOP taking these medications    doxycycline 100 MG capsule Commonly known as: VIBRAMYCIN   indomethacin 50 MG capsule Commonly known as: INDOCIN   oxyCODONE-acetaminophen 7.5-325 MG tablet Commonly known as: Percocet   predniSONE 50 MG tablet Commonly known as: DELTASONE       TAKE these medications    acetaminophen 500 MG tablet Commonly known as: TYLENOL Take 1,000 mg by mouth every 6 (six) hours as needed for mild pain or moderate pain.   allopurinol 100 MG tablet Commonly known as: ZYLOPRIM Take 100 mg by mouth daily as needed (for gout flare ups).   benzonatate 100 MG capsule Commonly known as: TESSALON Take 100-200 mg by mouth 3 (three) times daily as needed for cough.   furosemide 40 MG tablet Commonly known as: Lasix Take 1 tablet (40 mg total) by mouth daily.   naproxen sodium 220 MG tablet Commonly known as: ALEVE Take 220 mg by mouth 2 (two) times daily as needed (for pain).   potassium chloride 10 MEQ tablet Commonly known as: KLOR-CON Take 1 tablet (10 mEq total) by mouth daily.        Allergies  Allergen Reactions   Percocet [Oxycodone-Acetaminophen] Itching     If you experience worsening of your admission symptoms, develop shortness of breath, life threatening emergency, suicidal or homicidal thoughts you must seek medical attention immediately by calling 911 or calling your MD immediately  if symptoms less severe.    Please note   You were cared for by a hospitalist during your hospital stay. If you have any questions about your discharge medications or the care you received while you were in the hospital after you are discharged, you can call the unit and asked to speak with the hospitalist on call if the hospitalist that took care of you is not available. Once you are discharged,  your primary care physician will handle any further medical issues. Please note that NO REFILLS for any discharge medications will be authorized once you are discharged, as it is imperative that you return to your primary care physician (or establish a relationship with a primary care physician if you do not have one) for your aftercare needs so that they can reassess your need for medications and monitor your lab values.   Consultations: None    Procedures/Studies: DG Chest 2 View  Result Date: 05/22/2021 CLINICAL DATA:  Shortness of breath with exertion. Cough. Lower extremity edema. EXAM: CHEST - 2 VIEW COMPARISON:  None. FINDINGS: Eventration of the right hemidiaphragm. Heart size upper normal. There are patchy right suprahilar opacities. Mild peribronchial thickening. No pleural fluid or pneumothorax. No acute osseous abnormalities are seen. IMPRESSION: 1. Borderline cardiomegaly. Mild peribronchial thickening, may be bronchitis  or pulmonary edema. 2. Patchy right suprahilar opacity. Atelectasis versus pneumonia. Recommend radiographic follow-up in 3-4 weeks to assess for change or resolution. Electronically Signed   By: Narda Rutherford M.D.   On: 05/22/2021 22:12   CT Angio Chest PE W and/or Wo Contrast  Result Date: 05/23/2021 CLINICAL DATA:  Shortness of breath with exertion. Cough. Edema in the right lower extremity EXAM: CT ANGIOGRAPHY CHEST WITH CONTRAST TECHNIQUE: Multidetector CT imaging of the chest was performed using the standard protocol during bolus administration of intravenous contrast. Multiplanar CT image reconstructions and MIPs were obtained to evaluate the vascular anatomy. CONTRAST:  57mL OMNIPAQUE IOHEXOL 350 MG/ML SOLN COMPARISON:  None. FINDINGS: Cardiovascular: Satisfactory opacification of the pulmonary arteries to the segmental level. No evidence of pulmonary embolism. Normal heart size. No pericardial effusion. Mediastinum/Nodes: Negative for adenopathy or mass  Lungs/Pleura: This streaky opacity asymmetric to the right lung with volume loss. No edema or effusion. Upper Abdomen: Cholelithiasis. Hepatic steatosis. No acute finding were clear change from 2015 Musculoskeletal: No acute or aggressive finding Review of the MIP images confirms the above findings. IMPRESSION: 1. Negative for pulmonary embolism. 2. Multi segment atelectasis on the right. There is associated airway thickening, question underlying bronchopneumonia. 3. Cholelithiasis and hepatic steatosis. Electronically Signed   By: Marnee Spring M.D.   On: 05/23/2021 07:30   ECHOCARDIOGRAM COMPLETE  Result Date: 05/23/2021    ECHOCARDIOGRAM REPORT   Patient Name:   Jordan Daniels Date of Exam: 05/23/2021 Medical Rec #:  [id number]       Height:       70.0 in Accession #:    [id number]      Weight:       341.0 lb Date of Birth:  [birth year]        BSA:          2.619 m Patient Age:    55 years        BP:           140/83 mmHg Patient Gender: M               HR:           81 bpm. Exam Location:  Inpatient Procedure: 2D Echo, Cardiac Doppler and Color Doppler Indications:    CHF  History:        Patient has no prior history of Echocardiogram examinations.                 Risk Factors:Former Smoker.  Sonographer:    Shirlean Nyleah Mcginnis Referring Phys: [id number] [facility] RATHORE  Sonographer Comments: Image acquisition challenging due to patient body habitus. IMPRESSIONS  1. Left ventricular ejection fraction, by estimation, is 60 to 65%. The left ventricle has normal function. The left ventricle has no regional wall motion abnormalities. Left ventricular diastolic parameters were normal.  2. Right ventricular systolic function is normal. The right ventricular size is normal.  3. The mitral valve is normal in structure. Trivial mitral valve regurgitation. No evidence of mitral stenosis.  4. The aortic valve is tricuspid. Aortic valve regurgitation is not visualized. No aortic stenosis is present.  5. Aortic dilatation  noted. There is borderline dilatation of the aortic root, measuring 38 mm. FINDINGS  Left Ventricle: Left ventricular ejection fraction, by estimation, is 60 to 65%. The left ventricle has normal function. The left ventricle has no regional wall motion abnormalities. The left ventricular internal cavity size was normal in size. There is  no left ventricular hypertrophy.  Left ventricular diastolic parameters were normal. Right Ventricle: The right ventricular size is normal. Right ventricular systolic function is normal. Left Atrium: Left atrial size was normal in size. Right Atrium: Right atrial size was normal in size. Pericardium: There is no evidence of pericardial effusion. Mitral Valve: The mitral valve is normal in structure. Trivial mitral valve regurgitation. No evidence of mitral valve stenosis. Tricuspid Valve: The tricuspid valve is normal in structure. Tricuspid valve regurgitation is trivial. No evidence of tricuspid stenosis. Aortic Valve: The aortic valve is tricuspid. Aortic valve regurgitation is not visualized. No aortic stenosis is present. Aortic valve mean gradient measures 4.0 mmHg. Aortic valve peak gradient measures 8.5 mmHg. Aortic valve area, by VTI measures 3.19 cm. Pulmonic Valve: The pulmonic valve was normal in structure. Pulmonic valve regurgitation is not visualized. No evidence of pulmonic stenosis. Aorta: Aortic dilatation noted. There is borderline dilatation of the aortic root, measuring 38 mm. Venous: The inferior vena cava was not well visualized. IAS/Shunts: The interatrial septum was not well visualized.  LEFT VENTRICLE PLAX 2D LVIDd:         5.20 cm  Diastology LVIDs:         3.50 cm  LV e' medial:    7.29 cm/s LV PW:         1.10 cm  LV E/e' medial:  11.2 LV IVS:        0.80 cm  LV e' lateral:   11.10 cm/s LVOT diam:     2.20 cm  LV E/e' lateral: 7.3 LV SV:         87 LV SV Index:   33 LVOT Area:     3.80 cm  RIGHT VENTRICLE RV Basal diam:  3.20 cm RV S prime:     11.60  cm/s TAPSE (M-mode): 2.3 cm LEFT ATRIUM             Index       RIGHT ATRIUM           Index LA diam:        4.40 cm 1.68 cm/m  RA Area:     13.60 cm LA Vol (A2C):   56.3 ml 21.50 ml/m RA Volume:   30.70 ml  11.72 ml/m LA Vol (A4C):   69.6 ml 26.58 ml/m LA Biplane Vol: 61.8 ml 23.60 ml/m  AORTIC VALVE AV Area (Vmax):    3.20 cm AV Area (Vmean):   3.21 cm AV Area (VTI):     3.19 cm AV Vmax:           146.00 cm/s AV Vmean:          94.100 cm/s AV VTI:            0.274 m AV Peak Grad:      8.5 mmHg AV Mean Grad:      4.0 mmHg LVOT Vmax:         123.00 cm/s LVOT Vmean:        79.400 cm/s LVOT VTI:          0.230 m LVOT/AV VTI ratio: 0.84  AORTA Ao Root diam: 3.80 cm Ao Asc diam:  3.40 cm MITRAL VALVE MV Area (PHT): 3.42 cm    SHUNTS MV Decel Time: 222 msec    Systemic VTI:  0.23 m MV E velocity: 81.40 cm/s  Systemic Diam: 2.20 cm MV A velocity: 50.20 cm/s MV E/A ratio:  1.62 Olga Millers MD Electronically signed by Olga Millers MD Signature Date/Time: 05/23/2021/1:33:17  PM    Final     Echo   Subjective: Pt feels well this AM.  No CP, SOB or other complaints.     Discharge Exam: Vitals:   05/23/21 2100 05/24/21 0524  BP: 138/73 136/74  Pulse: 82 84  Resp: 18 18  Temp: 98.2 F (36.8 C) 98.4 F (36.9 C)  SpO2: 98% 97%   Vitals:   05/23/21 1204 05/23/21 2100 05/24/21 0500 05/24/21 0524  BP: 139/80 138/73  136/74  Pulse: 71 82  84  Resp: Temp: 98.2 F (36.8 C) 98.2 F (36.8 C)  98.4 F (36.9 C)  TempSrc: Oral Oral  Oral  SpO2: 97% 98%  97%  Weight:   (!) 150.4 kg   Height:        General: Pt is alert, awake, not in acute distress Cardiovascular: RRR, S1/S2 +, no rubs, no gallops Respiratory: CTA bilaterally, no wheezing, no rhonchi Abdominal: Soft, NT, ND, bowel sounds + Extremities: no edema, no cyanosis    The results of significant diagnostics from this hospitalization (including imaging, microbiology, ancillary and laboratory) are listed below for  reference.     Microbiology: No results found for this or any previous visit (from the past 240 hour(s)).    Labs: BNP (last 3 results) Recent Labs    05/22/21 2123  BNP 76.0   Basic Metabolic Panel: No results for input(s): NA, K, CL, CO2, GLUCOSE, BUN, CREATININE, CALCIUM, MG, PHOS in the last 168 hours.  Liver Function Tests: No results for input(s): AST, ALT, ALKPHOS, BILITOT, PROT, ALBUMIN in the last 168 hours.  No results for input(s): LIPASE, AMYLASE in the last 168 hours. No results for input(s): AMMONIA in the last 168 hours. CBC: No results for input(s): WBC, NEUTROABS, HGB, HCT, MCV, PLT in the last 168 hours.  Cardiac Enzymes: No results for input(s): CKTOTAL, CKMB, CKMBINDEX, TROPONINI in the last 168 hours. BNP: Invalid input(s): POCBNP CBG: No results for input(s): GLUCAP in the last 168 hours. D-Dimer No results for input(s): DDIMER in the last 72 hours.  Hgb A1c No results for input(s): HGBA1C in the last 72 hours. Lipid Profile No results for input(s): CHOL, HDL, LDLCALC, TRIG, CHOLHDL, LDLDIRECT in the last 72 hours. Thyroid function studies No results for input(s): TSH, T4TOTAL, T3FREE, THYROIDAB in the last 72 hours.  Invalid input(s): FREET3 Anemia work up No results for input(s): VITAMINB12, FOLATE, FERRITIN, TIBC, IRON, RETICCTPCT in the last 72 hours. Urinalysis    Component Value Date/Time   COLORURINE YELLOW 05/22/2021 2143   APPEARANCEUR CLEAR 05/22/2021 2143   LABSPEC 1.020 05/22/2021 2143   PHURINE 5.0 05/22/2021 2143   GLUCOSEU NEGATIVE 05/22/2021 2143   HGBUR NEGATIVE 05/22/2021 2143   BILIRUBINUR NEGATIVE 05/22/2021 2143   KETONESUR NEGATIVE 05/22/2021 2143   PROTEINUR NEGATIVE 05/22/2021 2143   NITRITE NEGATIVE 05/22/2021 2143   LEUKOCYTESUR SMALL (A) 05/22/2021 2143   Sepsis Labs Invalid input(s): PROCALCITONIN,  WBC,  LACTICIDVEN Microbiology No results found for this or any previous visit (from the past 240  hour(s)).    Time coordinating discharge: Over 30 minutes  SIGNED:   Pennie Banter, DO [facility] 06/15/2021, 4:48 PM   If 7PM-7AM, please contact night-coverage [email or web address]

## 2021-05-24 NOTE — Progress Notes (Signed)
Ambulated in hallway on RA x 100'. Tolerated well. O2 sats remained 95 - 97%. Denies SOB, dizziness, weakness or other.

## 2021-05-24 NOTE — Plan of Care (Signed)

## 2021-06-15 DIAGNOSIS — R0600 Dyspnea, unspecified: Secondary | ICD-10-CM | POA: Diagnosis present

## 2021-06-15 NOTE — Assessment & Plan Note (Signed)
CHF ruled out with normal Echo, no prior CHF history.

## 2025-01-01 ENCOUNTER — Emergency Department (HOSPITAL_COMMUNITY)
Admission: EM | Admit: 2025-01-01 | Discharge: 2025-01-02 | Disposition: A | Payer: Self-pay | Attending: Emergency Medicine | Admitting: Emergency Medicine

## 2025-01-01 ENCOUNTER — Other Ambulatory Visit: Payer: Self-pay

## 2025-01-01 ENCOUNTER — Encounter (HOSPITAL_COMMUNITY): Payer: Self-pay | Admitting: Emergency Medicine

## 2025-01-01 DIAGNOSIS — H9193 Unspecified hearing loss, bilateral: Secondary | ICD-10-CM | POA: Insufficient documentation

## 2025-01-01 DIAGNOSIS — R030 Elevated blood-pressure reading, without diagnosis of hypertension: Secondary | ICD-10-CM

## 2025-01-01 DIAGNOSIS — R739 Hyperglycemia, unspecified: Secondary | ICD-10-CM | POA: Insufficient documentation

## 2025-01-01 DIAGNOSIS — H9313 Tinnitus, bilateral: Secondary | ICD-10-CM | POA: Insufficient documentation

## 2025-01-01 DIAGNOSIS — R42 Dizziness and giddiness: Secondary | ICD-10-CM

## 2025-01-01 LAB — COMPREHENSIVE METABOLIC PANEL WITH GFR
ALT: 18 U/L (ref 0–44)
AST: 28 U/L (ref 15–41)
Albumin: 4.1 g/dL (ref 3.5–5.0)
Alkaline Phosphatase: 61 U/L (ref 38–126)
Anion gap: 10 (ref 5–15)
BUN: 22 mg/dL — ABNORMAL HIGH (ref 6–20)
CO2: 27 mmol/L (ref 22–32)
Calcium: 9 mg/dL (ref 8.9–10.3)
Chloride: 104 mmol/L (ref 98–111)
Creatinine, Ser: 1.09 mg/dL (ref 0.61–1.24)
GFR, Estimated: >60 mL/min
Glucose, Bld: 135 mg/dL — ABNORMAL HIGH (ref 70–99)
Potassium: 4.1 mmol/L (ref 3.5–5.1)
Sodium: 141 mmol/L (ref 135–145)
Total Bilirubin: 1.4 mg/dL — ABNORMAL HIGH (ref 0.0–1.2)
Total Protein: 6.8 g/dL (ref 6.5–8.1)

## 2025-01-01 LAB — CBC WITH DIFFERENTIAL/PLATELET
Abs Immature Granulocytes: 0.04 10*3/uL (ref 0.00–0.07)
Basophils Absolute: 0 10*3/uL (ref 0.0–0.1)
Basophils Relative: 0 %
Eosinophils Absolute: 0.1 10*3/uL (ref 0.0–0.5)
Eosinophils Relative: 1 %
HCT: 50.3 % (ref 39.0–52.0)
Hemoglobin: 16.1 g/dL (ref 13.0–17.0)
Immature Granulocytes: 0 %
Lymphocytes Relative: 16 %
Lymphs Abs: 1.4 10*3/uL (ref 0.7–4.0)
MCH: 29.1 pg (ref 26.0–34.0)
MCHC: 32 g/dL (ref 30.0–36.0)
MCV: 91 fL (ref 80.0–100.0)
Monocytes Absolute: 0.4 10*3/uL (ref 0.1–1.0)
Monocytes Relative: 5 %
Neutro Abs: 7 10*3/uL (ref 1.7–7.7)
Neutrophils Relative %: 78 %
Platelets: 200 10*3/uL (ref 150–400)
RBC: 5.53 MIL/uL (ref 4.22–5.81)
RDW: 15.4 % (ref 11.5–15.5)
WBC: 9 10*3/uL (ref 4.0–10.5)
nRBC: 0 % (ref 0.0–0.2)

## 2025-01-01 LAB — TROPONIN T, HIGH SENSITIVITY: Troponin T High Sensitivity: 27 ng/L — ABNORMAL HIGH (ref 0–19)

## 2025-01-01 NOTE — ED Triage Notes (Signed)
 Patient reports ringing in his ears, blurred vision, and dizziness that lasted for about a minute yesterday afternoon.  Symptoms have resolved, but the ringing in ears has stayed.  Patient now c/o htn, no history of htn, does not take meds.

## 2025-01-02 LAB — TROPONIN T, HIGH SENSITIVITY: Troponin T High Sensitivity: 24 ng/L — ABNORMAL HIGH (ref 0–19)

## 2025-01-02 NOTE — ED Provider Notes (Signed)
 " [facility] EMERGENCY DEPARTMENT AT [facility] Provider Note  CSN: [id number] Arrival date & time: 01/01/25 2226  Chief Complaint(s) No chief complaint on file.  History provided by patient. HPI & MDM Jordan Daniels is a 59 y.o. male here for episode of lightheadedness, blurry vision and bilateral ringing in his ears that lasted approximately 1 minute and resolved.  Ringing in the ears persisted for additional period of time, subsided and now resulted in decreased hearing in bilateral ears, left greater than right.  This occurred over 24 hours ago.  Patient reports that he checked his blood pressure and it is was elevated at that time with systolics in the 170s.  Patient denied any stimulant use, illicit drug use, alcohol use.  He does not smoke.  He denies any focal weakness, paresthesias, loss of sensation, no ataxia Patient denies any associated chest pain or shortness of breath at that time.  He denies any neck pain.  He denies any recent fevers, infections, cough.  No nausea or vomiting.   HPI    Medical Decision Making Amount and/or Complexity of Data Reviewed Labs: ordered. Decision-making details documented in ED Course. ECG/medicine tests: ordered and independent interpretation performed. Decision-making details documented in ED Course.   The patient's presentation involves an extensive number of treatment options, and the complaint(s) carry with it a high risk of complications and morbidity. The differential diagnosis includes but not limited to those listed below:   No focal deficits concerning for CVA. Workup without electrolyte derangements.  He does have mild hyperglycemia without DKA.  No renal insufficiency.  No severe anemia. Labs in triage included troponin which was slightly elevated but delta troponin was relatively flat.  Since patient did not have any associated chest pain or shortness of breath with a nonischemic EKG, favor chronic leak. Blood pressure  improved without intervention here in the emergency department.  Final Clinical Impression(s) / ED Diagnoses Final diagnoses:  Elevated blood pressure reading  Dizziness  Ringing in ear, bilateral  Bilateral hearing loss, unspecified hearing loss type   The patient appears reasonably screened and/or stabilized for discharge and I doubt any other medical condition or other [facility] requiring further screening, evaluation, or treatment in the ED at this time. I have discussed the findings, Dx and Tx plan with the patient/family who expressed understanding and agree(s) with the plan. Discharge instructions discussed at length. The patient/family was given strict return precautions who verbalized understanding of the instructions. No further questions at time of discharge.  Disposition: Discharge  Condition: Good  ED Discharge Orders     None       Follow Up: Carlie Clark, MD [address] [place] [address] [phone]  Call  to schedule an appointment for close follow up  [place], [facility] [address], [address] [place] [address] [phone]  Call  to schedule an appointment for close follow up     Past Medical History Past Medical History:  Diagnosis Date   Arthritis    ankles   GERD (gastroesophageal reflux disease)    occasional   Gout    History of kidney stones    Sleep apnea    never diagnosed-but pretty sure   Patient Active Problem List   Diagnosis Date Noted   Dyspnea 06/15/2021   Acute hypoxemic respiratory failure (HCC) 05/23/2021   Morbid obesity (HCC) 05/23/2021   GERD (gastroesophageal reflux disease) 05/23/2021   Gout 09/08/2012  Home Medication(s) Prior to Admission medications  Medication Sig Start Date End Date Taking? Authorizing Provider  acetaminophen  (TYLENOL ) 500 MG tablet Take 1,000 mg by mouth every 6 (six) hours as needed for mild pain or moderate pain.    [provider]  allopurinol (ZYLOPRIM) 100 MG tablet Take 100 mg by mouth daily as needed (for gout flare ups).     [provider]  benzonatate (TESSALON) 100 MG capsule Take 100-200 mg by mouth 3 (three) times daily as needed for cough. 05/09/21   [provider]  furosemide  (LASIX ) 40 MG tablet Take 1 tablet (40 mg total) by mouth daily. 05/24/21 09/21/21  Fausto Sor A, DO  naproxen sodium (ANAPROX) 220 MG tablet Take 220 mg by mouth 2 (two) times daily as needed (for pain).    [provider]  potassium chloride  (KLOR-CON ) 10 MEQ tablet Take 1 tablet (10 mEq total) by mouth daily. 05/24/21   Fausto Sor LABOR, DO                                                                                                                                    Allergies Percocet [oxycodone -acetaminophen ]  Review of Systems Review of Systems As noted in HPI  Physical Exam Vital Signs  I have reviewed the triage vital signs BP 138/76 (BP Location: Right Arm)   Pulse 77   Temp 98 F (36.7 C) (Temporal)   Resp 20   Ht 5' 10 (1.778 m)   Wt (!) 147.4 kg   SpO2 97%   BMI 46.63 kg/m   Physical Exam Vitals reviewed.  Constitutional:      General: He is not in acute distress.    Appearance: He is well-developed. He is obese. He is not diaphoretic.  HENT:     Head: Normocephalic and atraumatic.     Right Ear: Tympanic membrane and external ear normal.     Left Ear: Tympanic membrane and external ear normal. Decreased hearing noted.     Nose: Nose normal.     Mouth/Throat:     Mouth: Mucous membranes are moist.  Eyes:     General: No scleral icterus.    Conjunctiva/sclera: Conjunctivae normal.  Neck:     Trachea: Phonation normal.  Cardiovascular:     Rate and Rhythm: Normal rate and regular rhythm.  Pulmonary:     Effort: Pulmonary effort is normal. No respiratory distress.     Breath sounds: No stridor.  Abdominal:     General: There is no distension.  Musculoskeletal:         General: Normal range of motion.     Cervical back: Normal range of motion.  Neurological:     Mental Status: He is alert and oriented to person, place, and time.     Motor: Motor function is intact.     Coordination: Coordination is intact.  Psychiatric:  Behavior: Behavior normal.     ED Results and Treatments Labs (all labs ordered are listed, but only abnormal results are displayed) Labs Reviewed  COMPREHENSIVE METABOLIC PANEL WITH GFR - Abnormal; Notable for the following components:      Result Value   Glucose, Bld 135 (*)    BUN 22 (*)    Total Bilirubin 1.4 (*)    All other components within normal limits  TROPONIN T, HIGH SENSITIVITY - Abnormal; Notable for the following components:   Troponin T High Sensitivity 27 (*)    All other components within normal limits  TROPONIN T, HIGH SENSITIVITY - Abnormal; Notable for the following components:   Troponin T High Sensitivity 24 (*)    All other components within normal limits  CBC WITH DIFFERENTIAL/PLATELET                                                                                                                         EKG  EKG Interpretation Date/Time:  Monday January 01 2025 22:39:44 EST Ventricular Rate:  81 PR Interval:  150 QRS Duration:  76 QT Interval:  380 QTC Calculation: 441 R Axis:   87  Text Interpretation: Normal sinus rhythm Normal ECG When compared with ECG of 22-May-2021 21:13, PREVIOUS ECG IS PRESENT Confirmed by Trine Likes [phone]) on 01/02/2025 3:56:19 AM       Radiology No results found.  Medications Ordered in ED Medications - No data to display Procedures Procedures  (including critical care time)   This chart was dictated using voice recognition software.  Despite best efforts to proofread,  errors can occur which can change the documentation meaning.   Trine Likes Moder, MD 01/02/25 [phone]  "
